# Patient Record
Sex: Female | Born: 1970 | Race: Black or African American | Hispanic: No | Marital: Single | Smoking: Never smoker
Health system: Southern US, Community
[De-identification: ages and names within clinical notes are randomized; demographics above are authoritative.]

## PROBLEM LIST (undated history)

## (undated) DIAGNOSIS — K219 Gastro-esophageal reflux disease without esophagitis: Secondary | ICD-10-CM

## (undated) DIAGNOSIS — I509 Heart failure, unspecified: Secondary | ICD-10-CM

## (undated) DIAGNOSIS — K4090 Unilateral inguinal hernia, without obstruction or gangrene, not specified as recurrent: Secondary | ICD-10-CM

## (undated) DIAGNOSIS — T7840XA Allergy, unspecified, initial encounter: Secondary | ICD-10-CM

## (undated) DIAGNOSIS — D649 Anemia, unspecified: Secondary | ICD-10-CM

## (undated) DIAGNOSIS — Z789 Other specified health status: Secondary | ICD-10-CM

## (undated) DIAGNOSIS — K439 Ventral hernia without obstruction or gangrene: Secondary | ICD-10-CM

## (undated) DIAGNOSIS — I10 Essential (primary) hypertension: Secondary | ICD-10-CM

## (undated) DIAGNOSIS — F32A Depression, unspecified: Secondary | ICD-10-CM

## (undated) HISTORY — PX: TONSILLECTOMY: SUR1361

## (undated) HISTORY — DX: Other specified health status: Z78.9

## (undated) HISTORY — DX: Gastro-esophageal reflux disease without esophagitis: K21.9

## (undated) HISTORY — DX: Heart failure, unspecified: I50.9

## (undated) HISTORY — DX: Essential (primary) hypertension: I10

## (undated) HISTORY — DX: Allergy, unspecified, initial encounter: T78.40XA

## (undated) HISTORY — PX: EYE SURGERY: SHX253

## (undated) HISTORY — DX: Depression, unspecified: F32.A

## (undated) HISTORY — DX: Ventral hernia without obstruction or gangrene: K43.9

## (undated) HISTORY — DX: Anemia, unspecified: D64.9

## (undated) HISTORY — DX: Unilateral inguinal hernia, without obstruction or gangrene, not specified as recurrent: K40.90

## (undated) HISTORY — PX: ABDOMINAL HYSTERECTOMY: SHX81

---

## 2001-10-02 ENCOUNTER — Other Ambulatory Visit: Admission: RE | Admit: 2001-10-02 | Discharge: 2001-10-02 | Payer: Self-pay | Admitting: Obstetrics and Gynecology

## 2003-11-04 ENCOUNTER — Encounter (INDEPENDENT_AMBULATORY_CARE_PROVIDER_SITE_OTHER): Payer: Self-pay | Admitting: *Deleted

## 2003-11-04 ENCOUNTER — Ambulatory Visit (HOSPITAL_BASED_OUTPATIENT_CLINIC_OR_DEPARTMENT_OTHER): Admission: RE | Admit: 2003-11-04 | Discharge: 2003-11-04 | Payer: Self-pay | Admitting: Otolaryngology

## 2003-11-04 ENCOUNTER — Ambulatory Visit (HOSPITAL_COMMUNITY): Admission: RE | Admit: 2003-11-04 | Discharge: 2003-11-04 | Payer: Self-pay | Admitting: Otolaryngology

## 2005-12-16 ENCOUNTER — Encounter (INDEPENDENT_AMBULATORY_CARE_PROVIDER_SITE_OTHER): Payer: Self-pay | Admitting: Specialist

## 2005-12-16 ENCOUNTER — Inpatient Hospital Stay (HOSPITAL_COMMUNITY): Admission: RE | Admit: 2005-12-16 | Discharge: 2005-12-18 | Payer: Self-pay | Admitting: Obstetrics and Gynecology

## 2008-03-19 ENCOUNTER — Emergency Department (HOSPITAL_COMMUNITY): Admission: EM | Admit: 2008-03-19 | Discharge: 2008-03-19 | Payer: Self-pay | Admitting: Emergency Medicine

## 2010-12-25 NOTE — Op Note (Signed)
[name], [name]                          ACCOUNT NO.:  [id number]   MEDICAL RECORD NO.:  [id number]                   PATIENT TYPE:  AMB   LOCATION:  DSC                                  FACILITY:  [facility]   PHYSICIAN:  Onalee Hua L. Annalee Genta, M.D.            DATE OF BIRTH:  10-25-1970   DATE OF PROCEDURE:  11/04/2003  DATE OF DISCHARGE:                                 OPERATIVE REPORT   PREOPERATIVE DIAGNOSIS:  1. Recurrent acute tonsillitis.  2. Chronic cryptic tonsillitis.  3. Tonsillar hypertrophy.   POSTOPERATIVE DIAGNOSIS:  1. Recurrent acute tonsillitis.  2. Chronic cryptic tonsillitis.  3. Tonsillar hypertrophy.   OPERATION PERFORMED:  Tonsillectomy.   SURGEON:  Kinnie Scales. Annalee Genta, M.D.   ANESTHESIA:  General endotracheal.   COMPLICATIONS:  None.   ESTIMATED BLOOD LOSS:  Minimal.   DISPOSITION:  The patient was then transferred from the operating room to  the recovery room in stable condition.   INDICATIONS FOR PROCEDURE:  [name] is a 40 year old black female who was  referred for evaluation of recurrent tonsillitis, chronic tonsillar  discharge and tonsillar enlargement.  She was treated with multiple course  of antibiotics, complained of chronic tonsil discharge and sore throat.  Given her history and examination, I recommended that we consider her for  tonsillectomy under general anesthesia.  The risks, benefits and possible  complications of the procedure were discussed in detail with the patient  prior to surgery.   DESCRIPTION OF PROCEDURE:  The patient was brought to the operating room to  the operating room on November 04, 2003 and placed in supine position on the  operating table.  General endotracheal anesthesia was established without  difficulty.  When the patient was adequately anesthetized, the Crowe-Davis  mouth gag was inserted without difficulty.  There were no loose or broken  teeth and the hard and soft palate were intact.  The patient had  significant  tonsillar hypertrophy.  Beginning on the patient's left hand side,  dissecting the subcapsular fascia with a Harmonic scalpel, the entire left  tonsil was removed.  Dissection was carried out from superior pole to tongue  base.  The right tonsil was removed in similar fashion.  Several areas of  point hemorrhage were cauterized using suction cautery.  Tonsillar fossa was  gently abraded with a dry tonsil sponge.  No active bleeding.  The patient's  oral cavity and oropharynx were then irrigated with saline and suctioned.  The Crowe-Davis mouth gag was released and removed.  There were no loose or broken teeth and no active bleeding.  The orogastric  tube was passed.  The stomach contents were aspirated.  The patient was then  awakened from anesthetic.  She was extubated and transferred from the  operating room to the recovery room in stable condition.  There were no  complications.  Blood loss minimal.  Kinnie Scales. Annalee Genta, M.D.    DLS/MEDQ  D:  60/45/4098  T:  11/04/2003  Job:  [id number]

## 2010-12-25 NOTE — Discharge Summary (Signed)
[name], [name]              ACCOUNT NO.:  [id number]   MEDICAL RECORD NO.:  [id number]          PATIENT TYPE:  INP   LOCATION:  A417                          FACILITY:  [facility]   PHYSICIAN:  Tilda Burrow, M.D. DATE OF BIRTH:  [birth year]   DATE OF ADMISSION:  12/16/2005  DATE OF DISCHARGE:  05/12/2007LH                                 DISCHARGE SUMMARY   ADMISSION DIAGNOSES:  1.  Menorrhagia.  2.  Menometrorrhagia.  3.  Anemia.  4.  Umbilical hernia.   DISCHARGE DIAGNOSES:  1.  Menorrhagia.  2.  Menometrorrhagia.  3.  Anemia.  4.  Umbilical hernia.  5.  Adenomyosis.  6.  Intramural leiomyoma (fibroids).   PROCEDURE:  Total abdominal hysterectomy, umbilical herniorrhaphy, wide  excision of abdominal wall cicatrix.   DISCHARGE MEDICATIONS:  1.  Tylox 1 q.6h. p.r.n. pain.  2.  Iron sulfate 325 mg twice daily x30 days.  3.  Laxative of choice p.r.n.   FOLLOW UP:  Follow up in one week for drain removal and staple removal.   HOSPITAL COURSE:  This 40 year old female was admitted for hysterectomy and  umbilical herniorrhaphy for heavy prolonged periods with subsequent anemia.  She had a prior cesarean section x2 and had resulted in an old scar that she  wished to have significantly revised.  This was performed.   HOSPITAL COURSE:  She was admitted with laboratory evaluation including  hemoglobin of 10.8, hematocrit of 32.6, platelet count 391,000.  Potassium  3.7, BUN 7, creatinine 0.6.  She underwent an uncomplicated hysterectomy  with postoperative hemoglobin 9.9 and hematocrit 29.9.  The patient's two  day postoperative course was straightforward with the patient stable for  discharge in excellent condition with passage of flatus, two bowel movements  having occurred by discharge on Dec 18, 2005.  She was afebrile.  She had  routine postsurgical instructions given watching for fever, etc. with the  patient allowed to shower, given instructions on how to manage the  JP  drains, and with plans for follow up Dec 22, 2005 with staple and drain  removal.   Pathology reports reveals a 153 gm uterus, symmetric with small fibroids and  adenomyosis noted on pathology.  The hernia sac was unremarkable other than  documentation of the hernia sac and there was no suspicion of malignancy.      Tilda Burrow, M.D.  Electronically Signed     JVF/MEDQ  D:  12/18/2005  T:  12/20/2005  Job:  [id number]   cc:   [facility]

## 2010-12-25 NOTE — H&P (Signed)
[name], [name]              ACCOUNT NO.:  [id number]   MEDICAL RECORD NO.:  [id number]          PATIENT TYPE:  AMB   LOCATION:  DAY                           FACILITY:  [facility]   PHYSICIAN:  Tilda Burrow, M.D. DATE OF BIRTH:  1971/01/30   DATE OF ADMISSION:  DATE OF DISCHARGE:  LH                                HISTORY & PHYSICAL   ADMISSION DIAGNOSES:  1.  Menorrhagia.  2.  Menometrorrhagia.  3.  Declining endometrial ablation.  4.  Anemia secondary to menometrorrhagia.   HISTORY OF PRESENT ILLNESS:  This 40 year old female is admitted for  abdominal hysterectomy.  Menses are prolonged and intermittent.  She has  menses sufficiently heavy to cause flooding 1 to 2 times per month.  She  describes her menses in impressive terms.  She has had heavy menses since  teenager.  She has only been controlled on oral contraceptives.  At 80, she  is looking for more definitive solution.  She declines endometrial ablation.  An ultrasound has been performed which shows a uterus which measures 9.4 cm  in length by 4.7 cm AP x 6.4 cm with a normal secretory endometrium on the  date of ultrasound.  She has normal adnexal structures.   PAST MEDICAL HISTORY:  Notable for short body habitus, 4 feet 9 inches, as  well as obesity.   PAST SURGICAL HISTORY:  The patient had prior cesarean sections x2 for  cephalopelvic disproportion.   She had medical evaluation including normal coagulation studies.  Hemoglobin  10, hematocrit 34.  Thyroid function tests showing TSH 0.839, completely  within normal limits.  GC and chlamydia cultures negative.   PLAN:  Total abdominal hysterectomy through Pfannenstiel incision, slight  wide excision of old scar on Dec 16, 2005.      Tilda Burrow, M.D.  Electronically Signed     JVF/MEDQ  D:  12/14/2005  T:  12/14/2005  Job:  [id number]   cc:   [facility]   [facility]  Fax: [phone]

## 2010-12-25 NOTE — Op Note (Signed)
NAMEJYLIAN, [name]              ACCOUNT NO.:  [id number]   MEDICAL RECORD NO.:  [id number]          PATIENT TYPE:  INP   LOCATION:  A417                          FACILITY:  [facility]   PHYSICIAN:  Tilda Burrow, M.D. DATE OF BIRTH:  1970-12-25   DATE OF PROCEDURE:  12/16/2005  DATE OF DISCHARGE:                                 OPERATIVE REPORT   PREOPERATIVE DIAGNOSES:  1.  Menorrhagia and metrorrhagia.  2.  Anemia.  3.  Umbilical hernia.   POSTOPERATIVE DIAGNOSES:  1.  Menorrhagia and metrorrhagia.  2.  Anemia.  3.  Repair of umbilical hernia.   OPERATION/PROCEDURE:  1.  Total abdominal hysterectomy.  2.  Wide excision of abdominal cicatrix.   SURGEON:  Tilda Burrow, M.D.   ASSISTANTAmie Critchley, C.S.T.   ANESTHESIA:  General.   COMPLICATIONS:  None.   FINDINGS:  Marked abdominal wall fibrosis and irregularity of the rectus  muscle.  Huge 4 x 5 cm umbilical hernia.   DESCRIPTION OF PROCEDURE:  The patient was taken to the operating room,  prepped and draped for abdominal procedure with Foley catheter in place,  vaginal tissues having been prepped.  The old Pfannenstiel incision was  marked.  As per the patient's request, this was widely excised with a 30 cm  long incision from just outside the anterior superior iliac crest on each  side of the midline and excising from the old scar line upward and taking  out the natural crease with the lower abdomen.  This resulted in removal of  approximately 30 cm long x 10 cm wide ellipse of skin and underlying fatty  tissue.  Bovie cautery was used as necessary to achieve hemostasis in  subcutaneous spaces.  Once this was taken down and hemostasis confirmed, we  proceeded with the abdominal portion of the case.  The rectus muscles were  split in the midline.  It was technically challenging to identify the  midline due to fibrosis from the prior cesarean sections.  We were able to  enter the midline at the upper aspects of  the incision, being careful to  elevate the preperitoneal tissue layer by layer.  Once we entered, then the  rest of the peritoneum opening was straightforward.  The surgeon's index  finger was kept underneath the incision as it was opened to insure safety of  underlying abdominal contents.  Bowel was satisfactorily decompressed from  its preoperative bowel prep, was elevated and packed away using three  moistened laparotomy tapes.   At this time we proceeded with hysterectomy.  The uterus was easily  accessible.  Had some anterior bladder adhesions from the prior cesarean  section repairs and healing.  These adhesions were taken down.  Bladder flap  was developed.  The uterus was boggy, soft, with a feeling suggestive of  adenomyosis.  Round ligament were clamped, cut and suture taken down.  Utero-  ovarian ligaments on either side were isolated, clamped cut and suture  ligated with Kelly clamps, mayo scissors and 0 chromic suture ligature.  The  uterine vessels were then crossclamped with curved  Heaney clamp and Kelly  clamp for backbleeding, transected and ligated.  Marching down to cardinal  ligaments on either side with small bites using straight Heaney clamps, Mayo  scissors and 0 chromic suture ligatures, took things down to the level of  the cervix.  The transverse white shiny tissue of the vaginal apex could be  identified and a stab incision made in the anterior cervical vaginal fornix.  Bladder was well out of the way.  Cervix was then amputated off the vaginal  cuff.  Cuff was then closed with Aldridge stitch at each lateral vaginal  angle_ to attach vaginal tissues to the remnants of a cardinal ligament.  The posterior width of the posterior vagina was reduced by a midline suture  which resulted in a little posterior pouch to improve functional vaginal  length.  The remainder of the vaginal cuff was closed transversely in a  running fashion with good hemostasis achieved.   Laparotomy moistened  laparotomy tape was placed in the pelvic floor after irrigation and then we  proceeded with umbilical repair.   UMBILICAL HERNIORRHAPHY:  Umbilical herniorrhaphy was then performed.  We  made a midline incision over the umbilicus after placing a laparotomy tape  directly into the hernia sac.  The hernia sac was distinctly defined by  margins.  We entered the cavity, opened the incision in the midline for  distance of about 3 cm, and then peeled a very thick fibrous peritoneal  tissues off the underlying connective tissue.  Once this was done, we were  able to identify the firm fibrous rim to the hernia defect.  Allis clamps  were used to grasp the edges.  The peritoneum was trimmed sufficiently to  later result in a good easy closure with interrupted Prolene sutures, used 2-  0 Prolene, with a transverse closure of the peritoneum.  The knots were  exterior to the peritoneal cavity.  We then were able to close the fascial  hernia defect, trimmed in the midline, a sagittal closure this time using a  series of running 0 Prolene suture, pulling the tissues together with good  obliteration of the sagittal defect.   The procedure was then continued by elimination of the potential space  defect in the subcutaneous fatty tissue.  This was pulled together with  interrupted 2-0 plain.  Skin over the hernia sac was trimmed and then the  skin edges pulled together with 2-0 plain suture followed by staple closure  of the skin.   Returning to the lower abdominal incision, we removed all laparotomy tapes,  inspected for hemostasis.  The appendix was inspected found to be  impressively large in its natural  anterior position, the appendix was at  least 10 cm long and quite large with no suspicion of acute inflammation.  We then proceeded with inspection of the pelvis.   Hemostasis was confirmed.  The bladder flap did not require reapproximation due to the natural resting  position of the peritoneal surface tissues.  We  then closed the anterior peritoneum with 2-0 chromic, closed the fascia with  running 0 Prolene, the midline incision then closed with the abdominal wall  incision.  There was some mobilization of the inferior aspects of the  incision necessary to get good tissue reapproximation. This was performed  then the subcutaneous fatty tissues were reapproximated using 2-0 plain  interrupted sutures with underlying flat Jackson-Pratt drains placed on  either side.  Stapled closure of the skin completed the procedure.  The  patient went to the recovery room in good condition with estimated blood  loss of 250 mL.     Tilda Burrow, M.D.  Electronically Signed    JVF/MEDQ  D:  12/18/2005  T:  12/20/2005  Job:  [id number]

## 2013-01-13 ENCOUNTER — Encounter (HOSPITAL_COMMUNITY): Payer: Self-pay | Admitting: *Deleted

## 2013-01-13 ENCOUNTER — Emergency Department (HOSPITAL_COMMUNITY)
Admission: EM | Admit: 2013-01-13 | Discharge: 2013-01-14 | Disposition: A | Discharge status: Home / Self Care | Payer: Self-pay | Attending: Emergency Medicine | Admitting: Emergency Medicine

## 2013-01-13 DIAGNOSIS — R109 Unspecified abdominal pain: Secondary | ICD-10-CM

## 2013-01-13 DIAGNOSIS — R112 Nausea with vomiting, unspecified: Secondary | ICD-10-CM | POA: Insufficient documentation

## 2013-01-13 DIAGNOSIS — R1013 Epigastric pain: Secondary | ICD-10-CM | POA: Insufficient documentation

## 2013-01-13 DIAGNOSIS — Z90710 Acquired absence of both cervix and uterus: Secondary | ICD-10-CM | POA: Insufficient documentation

## 2013-01-13 LAB — BASIC METABOLIC PANEL
Chloride: 100 mEq/L (ref 96–112)
Creatinine, Ser: 0.57 mg/dL (ref 0.50–1.10)
GFR calc Af Amer: >90 mL/min (ref >90)
Sodium: 137 mEq/L (ref 135–145)

## 2013-01-13 LAB — CBC WITH DIFFERENTIAL/PLATELET
Basophils Absolute: 0 10*3/uL (ref 0.0–0.1)
Basophils Relative: 0 % (ref 0–1)
HCT: 35.3 % — ABNORMAL LOW (ref 36.0–46.0)
MCHC: 33.4 g/dL (ref 30.0–36.0)
Monocytes Absolute: 0.7 10*3/uL (ref 0.1–1.0)
Neutro Abs: 8.2 10*3/uL — ABNORMAL HIGH (ref 1.7–7.7)
Neutrophils Relative %: 65 % (ref 43–77)
Platelets: 292 10*3/uL (ref 150–400)
RDW: 15.7 % — ABNORMAL HIGH (ref 11.5–15.5)

## 2013-01-13 MED ORDER — ONDANSETRON HCL 4 MG/2ML IJ SOLN
4.0000 mg | Freq: Once | INTRAMUSCULAR | Status: AC
  Administered 2013-01-13: 4 mg via INTRAVENOUS
  Filled 2013-01-13: qty 2

## 2013-01-13 NOTE — ED Provider Notes (Signed)
History    This chart was scribed for Bridget Gaskins, MD by Leone Payor, ED Scribe. This patient was seen in room APA19/APA19 and the patient's care was started 11:44 PM.   CSN: [id number]  Arrival date & time 01/13/13  2208   First MD Initiated Contact with Patient 01/13/13 2336      Chief Complaint  Patient presents with  . Nausea  . Emesis  . Abdominal Pain     Patient is a 42 y.o. female presenting with vomiting and abdominal pain. The history is provided by the patient. No language interpreter was used.  Emesis Severity:  Moderate Duration:  1 hour Timing:  Constant Quality:  Stomach contents and bilious material Progression:  Improving Chronicity:  New Recent urination:  Normal Context: not post-tussive and not self-induced   Relieved by:  Nothing Worsened by:  Nothing tried Ineffective treatments:  None tried Associated symptoms: abdominal pain   Associated symptoms: no diarrhea   Abdominal Pain Associated symptoms include abdominal pain. Pertinent negatives include no chest pain and no shortness of breath.    HPI Comments: [name] is a 42 y.o. female who presents to the Emergency Department complaining of sudden onset, intermittent, non-radiating abdominal pain that started about 2 hours ago. She describes the pain as cramping and states soon after onset of abdominal pain she began vomiting. States she was constantly vomiting for about 45 minutes. At first she vomited stomach contents and then yellow material. States she felt normal earlier today. Has had something similar to this pain before but was never evaluated for it. Reports last eating at 2:30PM today. Has h/o hysterectomy and 2 c-sections in the past. She denies diarrhea, fever, chest pain, SOB, dysuria.     PMH - none  Past Surgical History  Procedure Laterality Date  . Abdominal hysterectomy    . Tonsillectomy    . Eye surgery      History reviewed. No pertinent family  history.  History  Substance Use Topics  . Smoking status: Never Smoker   . Smokeless tobacco: Not on file  . Alcohol Use: No    OB History   Grav Para Term Preterm Abortions TAB SAB Ect Mult Living                  Review of Systems  Constitutional: Negative for fever.  Respiratory: Negative for shortness of breath.   Cardiovascular: Negative for chest pain.  Gastrointestinal: Positive for vomiting and abdominal pain. Negative for diarrhea.  Genitourinary: Negative for dysuria.  All other systems reviewed and are negative.    Allergies  Other  Home Medications  No current outpatient prescriptions on file.  BP 157/90  Pulse 61  Temp(Src) 97.5 F (36.4 C)  Resp 20  Ht 4\' 10"  (1.473 m)  Wt 236 lb (107.049 kg)  BMI 49.34 kg/m2  SpO2 98%  Physical Exam CONSTITUTIONAL: Well developed/well nourished HEAD: Normocephalic/atraumatic EYES: EOMI/PERRL. No icterus.  ENMT: Mucous membranes moist NECK: supple no meningeal signs SPINE:entire spine nontender CV: S1/S2 noted, no murmurs/rubs/gallops noted LUNGS: Lungs are clear to auscultation bilaterally, no apparent distress ABDOMEN: soft, no rebound or guarding. Moderate epigastric tenderness.  GU:no cva tenderness NEURO: Pt is awake/alert, moves all extremitiesx4 EXTREMITIES: pulses normal, full ROM SKIN: warm, color normal PSYCH: no abnormalities of mood noted  ED Course  Procedures   DIAGNOSTIC STUDIES: Oxygen Saturation is 98% on room air, normal by my interpretation.    COORDINATION OF CARE: 11:45 PM Discussed  treatment plan with pt at bedside and pt agreed to plan.   Labs Reviewed  CBC WITH DIFFERENTIAL - Abnormal; Notable for the following:    WBC 12.7 (*)    Hemoglobin 11.8 (*)    HCT 35.3 (*)    RDW 15.7 (*)    Neutro Abs 8.2 (*)    All other components within normal limits  BASIC METABOLIC PANEL - Abnormal; Notable for the following:    Potassium 3.4 (*)    Glucose, Bld 129 (*)    All other  components within normal limits  URINALYSIS, ROUTINE W REFLEX MICROSCOPIC   Pt felt improved.  She reports most of her pain was in epigastric region She is no longer vomiting.  She would like to go home  I feel biliary colic is possible however I doubt acute cholecystitis and do not feel she needs emergent transfer for ultrasound.  I spoke about need for further followup.  She does not have immediate followup with PCP.  After further discussion we decided to order next day abdominal ultrasound due to lack of access as outpatient.  I discussed strict return precautions before US imaging including worsened pain/vomiting.    I doubt appendicitis or other acute abdominal process.  I doubt ACS (EKG unremarkable, low risk for ACS) She had no lower abd tenderness and no dysuria to suggest UTI/pyelo  MDM  Nursing notes including past medical history and social history reviewed and considered in documentation Labs/vital reviewed and considered     Date: 01/14/2013  Rate: 64  Rhythm: normal sinus rhythm  QRS Axis: normal  Intervals: normal  ST/T Wave abnormalities: normal  Conduction Disutrbances:none  Narrative Interpretation:   Old EKG Reviewed: none available      I personally performed the services described in this documentation, which was scribed in my presence. The recorded information has been reviewed and is accurate.        Bridget Gaskins, MD 01/14/13 [phone]

## 2013-01-13 NOTE — ED Notes (Signed)
Pt reports sudden onset of abdominal pain that started about 1 hour ago.

## 2013-01-13 NOTE — ED Notes (Signed)
Pt c/o epigastric pain radiating down to lower abdomen and n/v x 45 mins.

## 2013-01-14 ENCOUNTER — Ambulatory Visit (HOSPITAL_COMMUNITY)
Admit: 2013-01-14 | Discharge: 2013-01-14 | Disposition: A | Discharge status: Home / Self Care | Payer: Self-pay | Source: Ambulatory Visit | Attending: Emergency Medicine | Admitting: Emergency Medicine

## 2013-01-14 ENCOUNTER — Other Ambulatory Visit: Payer: Self-pay

## 2013-01-14 DIAGNOSIS — R1013 Epigastric pain: Secondary | ICD-10-CM | POA: Insufficient documentation

## 2013-01-14 LAB — HEPATIC FUNCTION PANEL
AST: 16 U/L (ref 0–37)
Bilirubin, Direct: 0.1 mg/dL (ref 0.0–0.3)
Total Bilirubin: 0.2 mg/dL — ABNORMAL LOW (ref 0.3–1.2)

## 2013-01-14 LAB — URINE MICROSCOPIC-ADD ON

## 2013-01-14 LAB — URINALYSIS, ROUTINE W REFLEX MICROSCOPIC
Ketones, ur: NEGATIVE mg/dL
Leukocytes, UA: NEGATIVE
Nitrite: NEGATIVE
Urobilinogen, UA: 0.2 mg/dL (ref 0.0–1.0)

## 2013-01-14 LAB — LIPASE, BLOOD: Lipase: 16 U/L (ref 11–59)

## 2013-01-14 NOTE — ED Notes (Signed)
Pt alert & oriented x4, stable gait. Patient given discharge instructions, paperwork & prescription(s). Patient  instructed to stop at the registration desk to finish any additional paperwork. Patient verbalized understanding. Pt left department w/ no further questions. 

## 2013-10-11 ENCOUNTER — Other Ambulatory Visit (HOSPITAL_COMMUNITY): Payer: Self-pay | Admitting: Pulmonary Disease

## 2013-10-11 DIAGNOSIS — R112 Nausea with vomiting, unspecified: Secondary | ICD-10-CM

## 2013-10-11 DIAGNOSIS — R109 Unspecified abdominal pain: Secondary | ICD-10-CM

## 2013-10-12 ENCOUNTER — Ambulatory Visit (HOSPITAL_COMMUNITY)
Admission: RE | Admit: 2013-10-12 | Discharge: 2013-10-12 | Disposition: A | Discharge status: Home / Self Care | Payer: BC Managed Care – PPO | Source: Ambulatory Visit | Attending: Pulmonary Disease | Admitting: Pulmonary Disease

## 2013-10-12 DIAGNOSIS — R112 Nausea with vomiting, unspecified: Secondary | ICD-10-CM

## 2013-10-12 DIAGNOSIS — R109 Unspecified abdominal pain: Secondary | ICD-10-CM

## 2013-10-12 DIAGNOSIS — K439 Ventral hernia without obstruction or gangrene: Secondary | ICD-10-CM | POA: Insufficient documentation

## 2013-10-12 MED ORDER — IOHEXOL 300 MG/ML  SOLN
100.0000 mL | Freq: Once | INTRAMUSCULAR | Status: AC | PRN
  Administered 2013-10-12: 100 mL via INTRAVENOUS

## 2013-10-16 ENCOUNTER — Other Ambulatory Visit (HOSPITAL_COMMUNITY): Payer: Self-pay | Admitting: Pulmonary Disease

## 2013-10-16 DIAGNOSIS — R109 Unspecified abdominal pain: Secondary | ICD-10-CM

## 2013-10-17 ENCOUNTER — Encounter (HOSPITAL_COMMUNITY)
Admission: RE | Admit: 2013-10-17 | Discharge: 2013-10-17 | Disposition: A | Discharge status: Home / Self Care | Payer: BC Managed Care – PPO | Source: Ambulatory Visit | Attending: Pulmonary Disease | Admitting: Pulmonary Disease

## 2013-10-17 ENCOUNTER — Encounter (HOSPITAL_COMMUNITY): Payer: Self-pay

## 2013-10-17 DIAGNOSIS — R109 Unspecified abdominal pain: Secondary | ICD-10-CM

## 2013-10-17 MED ORDER — SINCALIDE 5 MCG IJ SOLR
INTRAMUSCULAR | Status: AC
  Administered 2013-10-17: 2.14 ug via INTRAVENOUS
  Filled 2013-10-17: qty 5

## 2013-10-17 MED ORDER — STERILE WATER FOR INJECTION IJ SOLN
INTRAMUSCULAR | Status: AC
  Administered 2013-10-17: 5 mL via INTRAVENOUS
  Filled 2013-10-17: qty 10

## 2013-10-17 MED ORDER — TECHNETIUM TC 99M MEBROFENIN IV KIT
5.0000 | PACK | Freq: Once | INTRAVENOUS | Status: AC | PRN
  Administered 2013-10-17: 5 via INTRAVENOUS

## 2013-11-20 ENCOUNTER — Other Ambulatory Visit: Payer: Self-pay | Admitting: Internal Medicine

## 2013-11-20 ENCOUNTER — Encounter (HOSPITAL_COMMUNITY): Payer: Self-pay | Admitting: Pharmacy Technician

## 2013-11-20 ENCOUNTER — Encounter (INDEPENDENT_AMBULATORY_CARE_PROVIDER_SITE_OTHER): Payer: Self-pay

## 2013-11-20 ENCOUNTER — Ambulatory Visit (INDEPENDENT_AMBULATORY_CARE_PROVIDER_SITE_OTHER): Payer: BC Managed Care – PPO | Admitting: Gastroenterology

## 2013-11-20 ENCOUNTER — Encounter: Payer: Self-pay | Admitting: Gastroenterology

## 2013-11-20 VITALS — BP 130/80 | HR 82 | Temp 98.2°F | Ht <= 58 in | Wt 226.8 lb

## 2013-11-20 DIAGNOSIS — R1013 Epigastric pain: Secondary | ICD-10-CM

## 2013-11-20 DIAGNOSIS — K3189 Other diseases of stomach and duodenum: Secondary | ICD-10-CM

## 2013-11-20 DIAGNOSIS — R1319 Other dysphagia: Secondary | ICD-10-CM

## 2013-11-20 NOTE — Progress Notes (Signed)
    Referring Provider: Hawkins, Edward L, MD Primary Care Physician:  HAWKINS,EDWARD L, MD Primary Gastroenterologist:  Dr. Rourk  Chief Complaint  Patient presents with  . Abdominal Pain  . Nausea  . Emesis    HPI:   [name] presents today at the request of Dr. Hawkins secondary to abdominal pain. CT March 2015 with anterior abdominal wall hernia only containing fat and small amount of fluid. No bowel incarceration. HIDA with EF of 88%, no symptoms during CCK infusion. US abdomen in June 2014 without gallstones.   Onset June of last year. Gets very hot then starts pouring sweat. Stomach starts cramping/squeezing pain in epigastric region, lasts for a long time until able to vomit. No pain with eating. No reflux symptoms. In past two weeks no vomiting, just lots of nausea. Not sure what she can or can't eat or drink. No specific triggers. Sometimes even happens with water. No dysphagia. No melena. No significant changes in bowel habits. Occasional constipation. Takes Miralax as needed. Just finished 14 day course of Prilosec OTC without significant change.   Past Medical History  Diagnosis Date  . Medical history non-contributory     Past Surgical History  Procedure Laterality Date  . Abdominal hysterectomy    . Tonsillectomy    . Eye surgery    . Cesarean section      x 2    No current outpatient prescriptions on file.   No current facility-administered medications for this visit.    Allergies as of 11/20/2013 - Review Complete 11/20/2013  Allergen Reaction Noted  . Other  01/13/2013    Family History  Problem Relation Age of Onset  . Colon cancer Neg Hx     History   Social History  . Marital Status: Single    Spouse Name: N/A    Number of Children: N/A  . Years of Education: N/A   Occupational History  . [employer]    Social History Main Topics  . Smoking status: Never Smoker   . Smokeless tobacco: Not on file  . Alcohol  Use: No  . Drug Use: No  . Sexual Activity: Not on file   Other Topics Concern  . Not on file   Social History Narrative  . No narrative on file    Review of Systems: As mentioned in HPI.   Physical Exam: BP 130/80  Pulse 82  Temp(Src) 98.2 F (36.8 C) (Oral)  Ht 4' 8" (1.422 m)  Wt 226 lb 12.8 oz (102.876 kg)  BMI 50.88 kg/m2 General:   Alert and oriented. Well-developed, well-nourished, pleasant and cooperative. Head:  Normocephalic and atraumatic. Eyes:  Conjunctiva pink, sclera clear, no icterus.   Conjunctiva pink. Ears:  Normal auditory acuity. Nose:  No deformity, discharge,  or lesions. Mouth:  No deformity or lesions, mucosa pink and moist.  Neck:  Supple, without mass or thyromegaly. Lungs:  Clear to auscultation bilaterally, without wheezing, rales, or rhonchi.  Heart:  S1, S2 present without murmurs noted.  Abdomen:  +BS, soft, non-tender and non-distended. Without mass or HSM. No rebound or guarding. No hernias noted. Rectal:  Deferred  Msk:  Symmetrical without gross deformities. Normal posture. Extremities:  Without clubbing or edema. Neurologic:  Alert and  oriented x4;  grossly normal neurologically. Skin:  Intact, warm and dry without significant lesions or rashes Cervical Nodes:  No significant cervical adenopathy. Psych:  Alert and cooperative. Normal mood and affect.  Outside labs Feb   2015:  Hgb 11.3, Hct 34, WBC 11.6, LFTs normal, amylase normal.

## 2013-11-20 NOTE — Patient Instructions (Signed)
Start taking Nexium once each morning, 30 minutes before breakfast. We have provided samples.  We have scheduled you for an upper endoscopy with Dr. Jena Gauss in the near future.  Start keeping a log of when you have these episodes to see if it is related to any food or certain triggers (stress, anxiety).

## 2013-11-22 NOTE — Assessment & Plan Note (Signed)
43 year old female with several month history of epigastric pain intermittently, now with new onset of nausea. Unable to identify specific triggers, and she has no relief until after vomiting. No typical reflux symptoms or improvement with OTC PPI for 14 day course. Gallbladder remains in situ but [place] of abdomen and HIDA on file as noted above. Question of anxiety-induced component but unable to rule out actual GI etiology.   Recommend trial of Nexium once daily. Proceed with upper endoscopy in the near future with Dr. Jena Gauss. The risks, benefits, and alternatives have been discussed in detail with patient. They have stated understanding and desire to proceed.  Consider elective cholecystectomy in future if no significant findings on EGD I have also asked patient to keep a log of episodes to determine any precipitating factors.

## 2013-11-26 NOTE — Progress Notes (Signed)
cc'd to pcp 

## 2013-11-28 ENCOUNTER — Other Ambulatory Visit: Payer: Self-pay

## 2013-11-28 ENCOUNTER — Encounter (HOSPITAL_COMMUNITY): Payer: Self-pay | Admitting: *Deleted

## 2013-11-28 ENCOUNTER — Ambulatory Visit (HOSPITAL_COMMUNITY)
Admission: RE | Admit: 2013-11-28 | Discharge: 2013-11-28 | Disposition: A | Discharge status: Home / Self Care | Payer: BC Managed Care – PPO | Source: Ambulatory Visit | Attending: Internal Medicine | Admitting: Internal Medicine

## 2013-11-28 ENCOUNTER — Encounter (HOSPITAL_COMMUNITY)
Admission: RE | Disposition: A | Discharge status: Home / Self Care | Payer: Self-pay | Source: Ambulatory Visit | Attending: Internal Medicine

## 2013-11-28 DIAGNOSIS — K2970 Gastritis, unspecified, without bleeding: Secondary | ICD-10-CM

## 2013-11-28 DIAGNOSIS — I499 Cardiac arrhythmia, unspecified: Secondary | ICD-10-CM | POA: Insufficient documentation

## 2013-11-28 DIAGNOSIS — R1319 Other dysphagia: Secondary | ICD-10-CM

## 2013-11-28 DIAGNOSIS — R1013 Epigastric pain: Secondary | ICD-10-CM | POA: Insufficient documentation

## 2013-11-28 DIAGNOSIS — R634 Abnormal weight loss: Secondary | ICD-10-CM | POA: Insufficient documentation

## 2013-11-28 DIAGNOSIS — K2990 Gastroduodenitis, unspecified, without bleeding: Secondary | ICD-10-CM

## 2013-11-28 DIAGNOSIS — R112 Nausea with vomiting, unspecified: Secondary | ICD-10-CM | POA: Insufficient documentation

## 2013-11-28 HISTORY — PX: ESOPHAGOGASTRODUODENOSCOPY: SHX5428

## 2013-11-28 SURGERY — EGD (ESOPHAGOGASTRODUODENOSCOPY)
Anesthesia: Moderate Sedation

## 2013-11-28 MED ORDER — MEPERIDINE HCL 100 MG/ML IJ SOLN
INTRAMUSCULAR | Status: DC | PRN
  Administered 2013-11-28 (×3): 25 mg via INTRAVENOUS
  Administered 2013-11-28: 50 mg via INTRAVENOUS

## 2013-11-28 MED ORDER — MEPERIDINE HCL 100 MG/ML IJ SOLN
INTRAMUSCULAR | Status: DC
Start: 2013-11-28 — End: 2013-11-28
  Filled 2013-11-28: qty 2

## 2013-11-28 MED ORDER — LIDOCAINE VISCOUS 2 % MT SOLN
OROMUCOSAL | Status: AC
  Filled 2013-11-28: qty 15

## 2013-11-28 MED ORDER — ONDANSETRON HCL 4 MG/2ML IJ SOLN
INTRAMUSCULAR | Status: AC
  Filled 2013-11-28: qty 2

## 2013-11-28 MED ORDER — MIDAZOLAM HCL 5 MG/5ML IJ SOLN
INTRAMUSCULAR | Status: DC | PRN
  Administered 2013-11-28: 1 mg via INTRAVENOUS
  Administered 2013-11-28: 2 mg via INTRAVENOUS
  Administered 2013-11-28: 1 mg via INTRAVENOUS
  Administered 2013-11-28: 2 mg via INTRAVENOUS

## 2013-11-28 MED ORDER — ONDANSETRON HCL 4 MG/2ML IJ SOLN
INTRAMUSCULAR | Status: DC | PRN
  Administered 2013-11-28: 4 mg via INTRAVENOUS

## 2013-11-28 MED ORDER — SODIUM CHLORIDE 0.9 % IV SOLN
INTRAVENOUS | Status: DC
  Administered 2013-11-28: 1000 mL via INTRAVENOUS

## 2013-11-28 MED ORDER — SIMETHICONE 40 MG/0.6ML PO SUSP
ORAL | Status: DC | PRN
  Administered 2013-11-28: 13:00:00

## 2013-11-28 MED ORDER — MIDAZOLAM HCL 5 MG/5ML IJ SOLN
INTRAMUSCULAR | Status: AC
  Filled 2013-11-28: qty 10

## 2013-11-28 MED ORDER — LIDOCAINE VISCOUS 2 % MT SOLN
OROMUCOSAL | Status: DC | PRN
Start: 2013-11-28 — End: 2013-11-28
  Administered 2013-11-28: 3 mL via OROMUCOSAL

## 2013-11-28 NOTE — Interval H&P Note (Signed)
History and Physical Interval Note:  11/28/2013 12:39 PM  [name]  has presented today for surgery, with the diagnosis of DYSPHAGIA  The various methods of treatment have been discussed with the patient and family. After consideration of risks, benefits and other options for treatment, the patient has consented to  Procedure(s) with comments: ESOPHAGOGASTRODUODENOSCOPY (EGD) (N/A) - 12:15 as a surgical intervention .  The patient's history has been reviewed, patient examined, no change in status, stable for surgery.  I have reviewed the patient's chart and labs.  Questions were answered to the patient's satisfaction.    No Change with brief course of Nexium. EGD per plan.  The risks, benefits, limitations, alternatives and imponderables have been reviewed with the patient. Potential for esophageal dilation, biopsy, etc. have also been reviewed.  Questions have been answered. All parties agreeable.  Gerrit Friends Teon Hudnall

## 2013-11-28 NOTE — Op Note (Signed)
[facility] [address] [place], [address]   ENDOSCOPY PROCEDURE REPORT  PATIENT: Bridget, Hernandez  MR#: [id number] BIRTHDATE: [birth year] , 42  yrs. old GENDER: Female ENDOSCOPIST: R.  Roetta Sessions, MD FACP [facility] REFERRED BY:  Kari Baars, M.D. PROCEDURE DATE:  11/28/2013 PROCEDURE:     EGD with gastric biopsy  INDICATIONS:    Intermittent epigastric pain, nausea vomiting; reported 20 pound weight loss; no improvement with PPI therapy.  INFORMED CONSENT:   The risks, benefits, limitations, alternatives and imponderables have been discussed.  The potential for biopsy, esophogeal dilation, etc. have also been reviewed.  Questions have been answered.  All parties agreeable.  Please see the history and physical in the medical record for more information.  MEDICATIONS:    Versed 6 mg IV and Demerol 125 mg IV in divided doses. Zofran 4 mg IV. Xylocaine gel orally  DESCRIPTION OF PROCEDURE:   The SW-[id number] ([id number])  endoscope was introduced through the mouth and advanced to the second portion of the duodenum without difficulty or limitations.  The mucosal surfaces were surveyed very carefully during advancement of the scope and upon withdrawal.  Retroflexion view of the proximal stomach and esophagogastric junction was performed.      FINDINGS: Normal appearing tubular esophagus. Stomach empty. Minimal submucosal petechiae and patchy erythema diffusely. No ulcer or infiltrating process..  Patent pylorus. Normal first and second portion of the duodenum  THERAPEUTIC / DIAGNOSTIC MANEUVERS PERFORMED:  Biopsies of the gastric mucosa taken for histologic study   COMPLICATIONS:  None  IMPRESSION:    Abnormal gastric mucosa of doubtful clinical significance-status post biopsy  RECOMMENDATIONS:  Followup on pathology. Patient noted to change in her rhythm during the procedure. Possibly intermittent bundle branch block. 12-lead EKG will be obtained  following this procedure and reviewed. Will be sent along with a rhythm strip to Dr. Juanetta Gosling attention.  Further GI recommendations to follow in the near future.    _______________________________ R. Roetta Sessions, MD FACP [facility] eSigned:  R. Roetta Sessions, MD FACP [facility] 11/28/2013 1:12 PM     CC:  PATIENT NAME:  Bridget, Hernandez MR#: [id number]

## 2013-11-28 NOTE — H&P (View-Only) (Signed)
Referring Provider: Fredirick MaudlinHawkins, Edward L, MD Primary Care Physician:  Fredirick MaudlinHAWKINS,EDWARD L, MD Primary Gastroenterologist:  Dr. Jena Gaussourk  Chief Complaint  Patient presents with  . Abdominal Pain  . Nausea  . Emesis    HPI:   [name] presents today at the request of Dr. Juanetta GoslingHawkins secondary to abdominal pain. CT March 2015 with anterior abdominal wall hernia only containing fat and small amount of fluid. No bowel incarceration. HIDA with EF of 88%, no symptoms during CCK infusion. US abdomen in June 2014 without gallstones.   Onset June of last year. Gets very hot then starts pouring sweat. Stomach starts cramping/squeezing pain in epigastric region, lasts for a long time until able to vomit. No pain with eating. No reflux symptoms. In past two weeks no vomiting, just lots of nausea. Not sure what she can or can't eat or drink. No specific triggers. Sometimes even happens with water. No dysphagia. No melena. No significant changes in bowel habits. Occasional constipation. Takes Miralax as needed. Just finished 14 day course of Prilosec OTC without significant change.   Past Medical History  Diagnosis Date  . Medical history non-contributory     Past Surgical History  Procedure Laterality Date  . Abdominal hysterectomy    . Tonsillectomy    . Eye surgery    . Cesarean section      x 2    No current outpatient prescriptions on file.   No current facility-administered medications for this visit.    Allergies as of 11/20/2013 - Review Complete 11/20/2013  Allergen Reaction Noted  . Other  01/13/2013    Family History  Problem Relation Age of Onset  . Colon cancer Neg Hx     History   Social History  . Marital Status: Single    Spouse Name: N/A    Number of Children: N/A  . Years of Education: N/A   Occupational History  . [employer] [place]    Social History Main Topics  . Smoking status: Never Smoker   . Smokeless tobacco: Not on file  . Alcohol  Use: No  . Drug Use: No  . Sexual Activity: Not on file   Other Topics Concern  . Not on file   Social History Narrative  . No narrative on file    Review of Systems: As mentioned in HPI.   Physical Exam: BP 130/80  Pulse 82  Temp(Src) 98.2 F (36.8 C) (Oral)  Ht 4\' 8"  (1.422 m)  Wt 226 lb 12.8 oz (102.876 kg)  BMI 50.88 kg/m2 General:   Alert and oriented. Well-developed, well-nourished, pleasant and cooperative. Head:  Normocephalic and atraumatic. Eyes:  Conjunctiva pink, sclera clear, no icterus.   Conjunctiva pink. Ears:  Normal auditory acuity. Nose:  No deformity, discharge,  or lesions. Mouth:  No deformity or lesions, mucosa pink and moist.  Neck:  Supple, without mass or thyromegaly. Lungs:  Clear to auscultation bilaterally, without wheezing, rales, or rhonchi.  Heart:  S1, S2 present without murmurs noted.  Abdomen:  +BS, soft, non-tender and non-distended. Without mass or HSM. No rebound or guarding. No hernias noted. Rectal:  Deferred  Msk:  Symmetrical without gross deformities. Normal posture. Extremities:  Without clubbing or edema. Neurologic:  Alert and  oriented x4;  grossly normal neurologically. Skin:  Intact, warm and dry without significant lesions or rashes Cervical Nodes:  No significant cervical adenopathy. Psych:  Alert and cooperative. Normal mood and affect.  Outside labs Feb  2015:  Hgb 11.3, Hct 34, WBC 11.6, LFTs normal, amylase normal.

## 2013-11-28 NOTE — Discharge Instructions (Addendum)
EGD Discharge instructions Please read the instructions outlined below and refer to this sheet in the next few weeks. These discharge instructions provide you with general information on caring for yourself after you leave the hospital. Your doctor may also give you specific instructions. While your treatment has been planned according to the most current medical practices available, unavoidable complications occasionally occur. If you have any problems or questions after discharge, please call your doctor. ACTIVITY  You may resume your regular activity but move at a slower pace for the next 24 hours.   Take frequent rest periods for the next 24 hours.   Walking will help expel (get rid of) the air and reduce the bloated feeling in your abdomen.   No driving for 24 hours (because of the anesthesia (medicine) used during the test).   You may shower.   Do not sign any important legal documents or operate any machinery for 24 hours (because of the anesthesia used during the test).  NUTRITION  Drink plenty of fluids.   You may resume your normal diet.   Begin with a light meal and progress to your normal diet.   Avoid alcoholic beverages for 24 hours or as instructed by your caregiver.  MEDICATIONS  You may resume your normal medications unless your caregiver tells you otherwise.  WHAT YOU CAN EXPECT TODAY  You may experience abdominal discomfort such as a feeling of fullness or gas pains.  FOLLOW-UP  Your doctor will discuss the results of your test with you.  SEEK IMMEDIATE MEDICAL ATTENTION IF ANY OF THE FOLLOWING OCCUR:  Excessive nausea (feeling sick to your stomach) and/or vomiting.   Severe abdominal pain and distention (swelling).   Trouble swallowing.   Temperature over 101 F (37.8 C).   Rectal bleeding or vomiting of blood.     Further recommendations to follow pending review of pathology report     Followup with Dr. Juanetta Gosling regarding abnormal heartbeat  during procedure

## 2013-11-30 ENCOUNTER — Encounter (HOSPITAL_COMMUNITY): Payer: Self-pay | Admitting: Internal Medicine

## 2013-12-04 ENCOUNTER — Encounter: Payer: Self-pay | Admitting: Internal Medicine

## 2013-12-04 ENCOUNTER — Telehealth: Payer: Self-pay

## 2013-12-04 NOTE — Telephone Encounter (Signed)
Letter from: Corbin Ade  Reason for Letter: Results Review  Send letter to patient.  Send copy of letter with path to referring provider and PCP.   Patient needs a consultation with a general surgeon? Dr. Franky Macho for consideration of hernia repair.

## 2013-12-05 ENCOUNTER — Other Ambulatory Visit: Payer: Self-pay | Admitting: Internal Medicine

## 2013-12-05 DIAGNOSIS — K439 Ventral hernia without obstruction or gangrene: Secondary | ICD-10-CM

## 2013-12-05 NOTE — Telephone Encounter (Signed)
Results Cc to PCP and Referral has been made to Dr. Jenkins 

## 2013-12-12 LAB — COMPREHENSIVE METABOLIC PANEL
ALT: 14 U/L (ref 7–35)
AST: 11 U/L
Alkaline Phosphatase: 47 U/L
Amylase: 29 units/L (ref 25–110)
Total Bilirubin: 0.4 mg/dL

## 2013-12-12 LAB — CBC
HEMATOCRIT: 34 %
HGB: 11.3 g/dL
WBC: 11.6

## 2014-02-18 ENCOUNTER — Other Ambulatory Visit (HOSPITAL_COMMUNITY): Payer: BC Managed Care – PPO

## 2014-02-22 ENCOUNTER — Ambulatory Visit (HOSPITAL_COMMUNITY): Admission: RE | Admit: 2014-02-22 | Payer: BC Managed Care – PPO | Source: Ambulatory Visit | Admitting: General Surgery

## 2014-02-22 ENCOUNTER — Encounter (HOSPITAL_COMMUNITY): Admission: RE | Payer: Self-pay | Source: Ambulatory Visit

## 2014-02-22 SURGERY — REPAIR, HERNIA, INCISIONAL
Anesthesia: General

## 2015-10-02 ENCOUNTER — Encounter: Payer: Self-pay | Admitting: Adult Health

## 2015-10-02 ENCOUNTER — Ambulatory Visit (INDEPENDENT_AMBULATORY_CARE_PROVIDER_SITE_OTHER): Payer: 59 | Admitting: Adult Health

## 2015-10-02 VITALS — BP 144/80 | HR 78 | Ht <= 58 in | Wt 236.0 lb

## 2015-10-02 DIAGNOSIS — Z1211 Encounter for screening for malignant neoplasm of colon: Secondary | ICD-10-CM

## 2015-10-02 DIAGNOSIS — Z01419 Encounter for gynecological examination (general) (routine) without abnormal findings: Secondary | ICD-10-CM

## 2015-10-02 DIAGNOSIS — Z01411 Encounter for gynecological examination (general) (routine) with abnormal findings: Secondary | ICD-10-CM | POA: Diagnosis not present

## 2015-10-02 DIAGNOSIS — K439 Ventral hernia without obstruction or gangrene: Secondary | ICD-10-CM | POA: Diagnosis not present

## 2015-10-02 HISTORY — DX: Ventral hernia without obstruction or gangrene: K43.9

## 2015-10-02 LAB — HEMOCCULT GUIAC POC 1CARD (OFFICE): Fecal Occult Blood, POC: NEGATIVE

## 2015-10-02 NOTE — Patient Instructions (Signed)
Physical in 1 year Mammogram now and yearly [phone] Call Dr Lovell Sheehan for consult regarding hernia

## 2015-10-02 NOTE — Progress Notes (Signed)
Patient ID: [name], female   DOB: 1971/01/01, 45 y.o.   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 PCP is Dr Juanetta Gosling.   Current Medications, Allergies, Past Medical History, Past Surgical History, Family History and Social History were reviewed in [employer] record.     Review of Systems: Patient denies any headaches, hearing loss, fatigue, blurred vision, shortness of breath, chest pain, abdominal pain, problems with bowel movements, urination, or intercourse(not having sex). No joint pain or mood swings.    Physical Exam:BP 144/80 mmHg  Pulse 78  Ht 4' 7.5" (1.41 m)  Wt 236 lb (107.049 kg)  BMI 53.84 kg/m2 General:  Well developed, well nourished, no acute distress Skin:  Warm and dry Neck:  Midline trachea, normal thyroid, good ROM, no lymphadenopathy Lungs; Clear to auscultation bilaterally Breast:  No dominant palpable mass, retraction, or nipple discharge Cardiovascular: Regular rate and rhythm Abdomen:  Soft, non tender, no hepatosplenomegaly, has right ventral hernia Pelvic:  External genitalia is normal in appearance, no lesions.  The vagina is normal in appearance. Urethra has no lesions or masses. The cervix and uterus are absent.  No adnexal masses or tenderness noted.Bladder is non tender, no masses felt. Rectal: Good sphincter tone, no polyps, or hemorrhoids felt.  Hemoccult negative. Extremities/musculoskeletal:  No swelling or varicosities noted, no clubbing or cyanosis Psych:  No mood changes, alert and cooperative,seems happy   Impression: Well woman gyn exam no pap Ventral hernia     Plan: Physical in 1 year Mammogram now and yearly  Call Dr Lovell Sheehan about hernia, she is aware if has pain to go to ER

## 2015-10-12 ENCOUNTER — Encounter (HOSPITAL_COMMUNITY): Payer: Self-pay | Admitting: *Deleted

## 2015-10-12 ENCOUNTER — Emergency Department (HOSPITAL_COMMUNITY)
Admission: EM | Admit: 2015-10-12 | Discharge: 2015-10-12 | Disposition: A | Discharge status: Home / Self Care | Payer: 59 | Attending: Emergency Medicine | Admitting: Emergency Medicine

## 2015-10-12 DIAGNOSIS — E876 Hypokalemia: Secondary | ICD-10-CM | POA: Diagnosis not present

## 2015-10-12 DIAGNOSIS — Z79899 Other long term (current) drug therapy: Secondary | ICD-10-CM | POA: Insufficient documentation

## 2015-10-12 DIAGNOSIS — Z90710 Acquired absence of both cervix and uterus: Secondary | ICD-10-CM | POA: Insufficient documentation

## 2015-10-12 DIAGNOSIS — K432 Incisional hernia without obstruction or gangrene: Secondary | ICD-10-CM | POA: Insufficient documentation

## 2015-10-12 DIAGNOSIS — R109 Unspecified abdominal pain: Secondary | ICD-10-CM | POA: Diagnosis present

## 2015-10-12 LAB — URINALYSIS, ROUTINE W REFLEX MICROSCOPIC
BILIRUBIN URINE: NEGATIVE
GLUCOSE, UA: NEGATIVE mg/dL
HGB URINE DIPSTICK: NEGATIVE
Ketones, ur: NEGATIVE mg/dL
Leukocytes, UA: NEGATIVE
Nitrite: NEGATIVE
PH: 7.5 (ref 5.0–8.0)
Protein, ur: NEGATIVE mg/dL
SPECIFIC GRAVITY, URINE: 1.01 (ref 1.005–1.030)

## 2015-10-12 LAB — CBC WITH DIFFERENTIAL/PLATELET
Basophils Absolute: 0 10*3/uL (ref 0.0–0.1)
Basophils Relative: 0 %
EOS ABS: 0.1 10*3/uL (ref 0.0–0.7)
EOS PCT: 0 %
HCT: 38.8 % (ref 36.0–46.0)
Hemoglobin: 12.5 g/dL (ref 12.0–15.0)
LYMPHS ABS: 1.8 10*3/uL (ref 0.7–4.0)
LYMPHS PCT: 11 %
MCH: 28.9 pg (ref 26.0–34.0)
MCHC: 32.2 g/dL (ref 30.0–36.0)
MCV: 89.8 fL (ref 78.0–100.0)
Monocytes Absolute: 0.8 10*3/uL (ref 0.1–1.0)
Monocytes Relative: 5 %
Neutro Abs: 13.3 10*3/uL — ABNORMAL HIGH (ref 1.7–7.7)
Neutrophils Relative %: 84 %
PLATELETS: 333 10*3/uL (ref 150–400)
RBC: 4.32 MIL/uL (ref 3.87–5.11)
RDW: 15.1 % (ref 11.5–15.5)
WBC: 16 10*3/uL — ABNORMAL HIGH (ref 4.0–10.5)

## 2015-10-12 LAB — COMPREHENSIVE METABOLIC PANEL
ALT: 17 U/L (ref 14–54)
AST: 17 U/L (ref 15–41)
Albumin: 4.1 g/dL (ref 3.5–5.0)
Alkaline Phosphatase: 56 U/L (ref 38–126)
Anion gap: 8 (ref 5–15)
BUN: 7 mg/dL (ref 6–20)
CHLORIDE: 101 mmol/L (ref 101–111)
CO2: 28 mmol/L (ref 22–32)
Calcium: 8.7 mg/dL — ABNORMAL LOW (ref 8.9–10.3)
Creatinine, Ser: 0.64 mg/dL (ref 0.44–1.00)
GFR calc Af Amer: >60 mL/min (ref >60)
GFR calc non Af Amer: >60 mL/min (ref >60)
Glucose, Bld: 116 mg/dL — ABNORMAL HIGH (ref 65–99)
Potassium: 3.3 mmol/L — ABNORMAL LOW (ref 3.5–5.1)
SODIUM: 137 mmol/L (ref 135–145)
Total Bilirubin: 0.5 mg/dL (ref 0.3–1.2)
Total Protein: 8.4 g/dL — ABNORMAL HIGH (ref 6.5–8.1)

## 2015-10-12 MED ORDER — OXYCODONE-ACETAMINOPHEN 5-325 MG PO TABS: 1.0000 | ORAL_TABLET | ORAL | Status: DC | PRN

## 2015-10-12 MED ORDER — POTASSIUM CHLORIDE CRYS ER 20 MEQ PO TBCR: 20.0000 meq | EXTENDED_RELEASE_TABLET | Freq: Two times a day (BID) | ORAL | Status: DC

## 2015-10-12 MED ORDER — MORPHINE SULFATE (PF) 2 MG/ML IV SOLN
4.0000 mg | Freq: Once | INTRAVENOUS | Status: AC
  Administered 2015-10-12: 4 mg via INTRAVENOUS
  Filled 2015-10-12: qty 2

## 2015-10-12 MED ORDER — SODIUM CHLORIDE 0.9 % IV SOLN
INTRAVENOUS | Status: DC
  Administered 2015-10-12: 05:00:00 via INTRAVENOUS

## 2015-10-12 MED ORDER — ONDANSETRON HCL 4 MG/2ML IJ SOLN
4.0000 mg | Freq: Once | INTRAMUSCULAR | Status: AC
  Administered 2015-10-12: 4 mg via INTRAVENOUS
  Filled 2015-10-12: qty 2

## 2015-10-12 MED ORDER — MORPHINE SULFATE (PF) 2 MG/ML IV SOLN
INTRAVENOUS | Status: AC
  Filled 2015-10-12: qty 1

## 2015-10-12 NOTE — ED Provider Notes (Signed)
CSN: [id number]     Arrival date & time 10/12/15  0358 History   First MD Initiated Contact with Patient 10/12/15 0440   Chief Complaint  Patient presents with  . Abdominal Pain     (Consider location/radiation/quality/duration/timing/severity/associated sxs/prior Treatment) HPI patient reports she has a right abdominal hernia that she's had for the past 3 years. She states she was evaluated by Dr. Lovell Sheehan however she decided to wait for surgery. She states it will pop out 2-3 times a month but normally she just lays down and it goes away. She usually has vomiting when it pops out. She states about 11:00 PM tonight the hernia popped out and she started having nausea and vomiting about 6 or 7 times. She denies diarrhea or fever. She states nothing she does makes her hurt worse, nothing she does makes it feel better. She feels like her knee still feels soft.   PCP Dr Juanetta Gosling  Past Medical History  Diagnosis Date  . Medical history non-contributory   . Anemia   . Hernia, inguinal   . Ventral hernia 10/02/2015   Past Surgical History  Procedure Laterality Date  . Abdominal hysterectomy    . Tonsillectomy    . Eye surgery    . Cesarean section      x 2  . Esophagogastroduodenoscopy N/A 11/28/2013    Procedure: ESOPHAGOGASTRODUODENOSCOPY (EGD);  Surgeon: Corbin Ade, MD;  Location: [facility];  Service: Endoscopy;  Laterality: N/A;  12:15   Family History  Problem Relation Age of Onset  . Colon cancer Neg Hx   . Cancer Mother     lung  . Other Father     was shot and killed  . Hyperlipidemia Sister   . Dementia Maternal Grandmother   . Heart attack Maternal Grandfather    Social History  Substance Use Topics  . Smoking status: Never Smoker   . Smokeless tobacco: Never Used  . Alcohol Use: Yes     Comment: occ   employed  OB History    Gravida Para Term Preterm AB TAB SAB Ectopic Multiple Living   4 2  1 1   1  2      Review of Systems  All other systems reviewed  and are negative.     Allergies  Other  Home Medications   Prior to Admission medications   Medication Sig Start Date End Date Taking? Authorizing Provider  esomeprazole (NEXIUM) 40 MG capsule Take 40 mg by mouth daily at 12 noon.   Yes Historical Provider, MD  IRON PO Take by mouth daily.   Yes Historical Provider, MD  MAGNESIUM PO Take by mouth daily.   Yes Historical Provider, MD  Probiotic Product (PROBIOTIC PO) Take by mouth daily.   Yes Historical Provider, MD  oxyCODONE-acetaminophen (PERCOCET/ROXICET) 5-325 MG tablet Take 1-2 tablets by mouth every 4 (four) hours as needed for severe pain. 10/12/15   Devoria Albe, MD  potassium chloride SA (K-DUR,KLOR-CON) 20 MEQ tablet Take 1 tablet (20 mEq total) by mouth 2 (two) times daily. 10/12/15   Devoria Albe, MD   BP 179/92 mmHg  Pulse 89  Temp(Src) 98.3 F (36.8 C) (Oral)  Resp 22  Ht 4' 7.5" (1.41 m)  Wt 236 lb (107.049 kg)  BMI 53.84 kg/m2  SpO2 96%  Vital signs normal   Physical Exam  Constitutional: She is oriented to person, place, and time. She appears well-developed and well-nourished.  Non-toxic appearance. She does not appear ill. She appears  distressed.  HENT:  Head: Normocephalic and atraumatic.  Right Ear: External ear normal.  Left Ear: External ear normal.  Nose: Nose normal. No mucosal edema or rhinorrhea.  Mouth/Throat: Oropharynx is clear and moist and mucous membranes are normal. No dental abscesses or uvula swelling.  Eyes: Conjunctivae and EOM are normal. Pupils are equal, round, and reactive to light.  Neck: Normal range of motion and full passive range of motion without pain. Neck supple.  Cardiovascular: Normal rate, regular rhythm and normal heart sounds.  Exam reveals no gallop and no friction rub.   No murmur heard. Pulmonary/Chest: Effort normal and breath sounds normal. No respiratory distress. She has no wheezes. She has no rhonchi. She has no rales. She exhibits no tenderness and no crepitus.    Abdominal: Soft. Normal appearance and bowel sounds are normal. She exhibits mass. She exhibits no distension. There is no tenderness. There is no rebound and no guarding.    Patient has a bulging in her right abdomen as shown on the drawing that is firm to touch. She seems more painful when she tries to lay flat.  Musculoskeletal: Normal range of motion. She exhibits no edema or tenderness.  Moves all extremities well.   Neurological: She is alert and oriented to person, place, and time. She has normal strength. No cranial nerve deficit.  Skin: Skin is warm, dry and intact. No rash noted. No erythema. No pallor.  Psychiatric: She has a normal mood and affect. Her speech is normal and behavior is normal. Her mood appears not anxious.  Nursing note and vitals reviewed.   ED Course  Procedures (including critical care time)  Medications  0.9 %  sodium chloride infusion ( Intravenous New Bag/Given 10/12/15 0515)  morphine 2 MG/ML injection 4 mg (4 mg Intravenous Given 10/12/15 0521)  ondansetron (ZOFRAN) injection 4 mg (4 mg Intravenous Given 10/12/15 0517)   Patient had IV inserted, she was given Zofran for nausea. I had ordered 4 mg of morphine for pain so I could reduce her hernia however she refused initially to take any pain medicine for high encouraged her to take it as it will help to reduce her hernia if she is more relaxed. She did consent to taking 2 mg  5:40 AM patient placed in Trendelenburg position with her head flat. With constant gentle pressure with my flat hand her hernia was reduced. It felt like it was trying to come out again and I had to use more gentle flat pressure and finally it was all reduced. She states she is having some pain in her right upper quadrant. She was given additional 2 mg of morphine.  06:05 pt was rechecked, the hernia has not reapeared. She was laid flat out of the trendelenburg position.   06:40 AM Pt ambulated to the bathroom.   Recheck at 07:00AM her  hernia remains reduced. She states she is painfree. We discussed to not do any heavy lifting or pulling and to follow-up with Dr. Lovell Sheehan and to consider having surgical repair of her hernia.  Labs Review Results for orders placed or performed during the hospital encounter of 10/12/15  Comprehensive metabolic panel  Result Value Ref Range   Sodium 137 135 - 145 mmol/L   Potassium 3.3 (L) 3.5 - 5.1 mmol/L   Chloride 101 101 - 111 mmol/L   CO2 28 22 - 32 mmol/L   Glucose, Bld 116 (H) 65 - 99 mg/dL   BUN 7 6 - 20 mg/dL  Creatinine, Ser 0.64 0.44 - 1.00 mg/dL   Calcium 8.7 (L) 8.9 - 10.3 mg/dL   Total Protein 8.4 (H) 6.5 - 8.1 g/dL   Albumin 4.1 3.5 - 5.0 g/dL   AST 17 15 - 41 U/L   ALT 17 14 - 54 U/L   Alkaline Phosphatase 56 38 - 126 U/L   Total Bilirubin 0.5 0.3 - 1.2 mg/dL   GFR calc non Af Amer >60 >60 mL/min   GFR calc Af Amer >60 >60 mL/min   Anion gap 8 5 - 15  CBC with Differential  Result Value Ref Range   WBC 16.0 (H) 4.0 - 10.5 K/uL   RBC 4.32 3.87 - 5.11 MIL/uL   Hemoglobin 12.5 12.0 - 15.0 g/dL   HCT 16.1 09.6 - 04.5 %   MCV 89.8 78.0 - 100.0 fL   MCH 28.9 26.0 - 34.0 pg   MCHC 32.2 30.0 - 36.0 g/dL   RDW 40.9 81.1 - 91.4 %   Platelets 333 150 - 400 K/uL   Neutrophils Relative % 84 %   Neutro Abs 13.3 (H) 1.7 - 7.7 K/uL   Lymphocytes Relative 11 %   Lymphs Abs 1.8 0.7 - 4.0 K/uL   Monocytes Relative 5 %   Monocytes Absolute 0.8 0.1 - 1.0 K/uL   Eosinophils Relative 0 %   Eosinophils Absolute 0.1 0.0 - 0.7 K/uL   Basophils Relative 0 %   Basophils Absolute 0.0 0.0 - 0.1 K/uL  Urinalysis, Routine w reflex microscopic  Result Value Ref Range   Color, Urine YELLOW YELLOW   APPearance CLEAR CLEAR   Specific Gravity, Urine 1.010 1.005 - 1.030   pH 7.5 5.0 - 8.0   Glucose, UA NEGATIVE NEGATIVE mg/dL   Hgb urine dipstick NEGATIVE NEGATIVE   Bilirubin Urine NEGATIVE NEGATIVE   Ketones, ur NEGATIVE NEGATIVE mg/dL   Protein, ur NEGATIVE NEGATIVE mg/dL   Nitrite  NEGATIVE NEGATIVE   Leukocytes, UA NEGATIVE NEGATIVE   Laboratory interpretation all normal except leukocytosis, mild hypokalemia   I have personally reviewed and evaluated these images and lab results as part of my medical decision-making.    MDM   Final diagnoses:  Hypokalemia  Recurrent ventral hernia    New Prescriptions   OXYCODONE-ACETAMINOPHEN (PERCOCET/ROXICET) 5-325 MG TABLET    Take 1-2 tablets by mouth every 4 (four) hours as needed for severe pain.   POTASSIUM CHLORIDE SA (K-DUR,KLOR-CON) 20 MEQ TABLET    Take 1 tablet (20 mEq total) by mouth 2 (two) times daily.    Plan discharge  Devoria Albe, MD, Concha Pyo, MD 10/12/15 [phone]

## 2015-10-12 NOTE — Discharge Instructions (Signed)
No heavy lifting or straining. Take the pain medication if the hernia comes out again. You need to lay down flat and have your legs elevated and place firm constant pressure with the flat of your hand until the hernia pops back in. If it does not pop back in a need to come to the ED. Consider discussing repair view hernia with Dr. Lovell Sheehan.   Hypokalemia Hypokalemia means that the amount of potassium in the blood is lower than normal.Potassium is a chemical, called an electrolyte, that helps regulate the amount of fluid in the body. It also stimulates muscle contraction and helps nerves function properly.Most of the body's potassium is inside of cells, and only a very small amount is in the blood. Because the amount in the blood is so small, minor changes can be life-threatening. CAUSES  Antibiotics.  Diarrhea or vomiting.  Using laxatives too much, which can cause diarrhea.  Chronic kidney disease.  Water pills (diuretics).  Eating disorders (bulimia).  Low magnesium level.  Sweating a lot. SIGNS AND SYMPTOMS  Weakness.  Constipation.  Fatigue.  Muscle cramps.  Mental confusion.  Skipped heartbeats or irregular heartbeat (palpitations).  Tingling or numbness. DIAGNOSIS  Your health care provider can diagnose hypokalemia with blood tests. In addition to checking your potassium level, your health care provider may also check other lab tests. TREATMENT Hypokalemia can be treated with potassium supplements taken by mouth or adjustments in your current medicines. If your potassium level is very low, you may need to get potassium through a vein (IV) and be monitored in the hospital. A diet high in potassium is also helpful. Foods high in potassium are:  Nuts, such as peanuts and pistachios.  Seeds, such as sunflower seeds and pumpkin seeds.  Peas, lentils, and lima beans.  Whole grain and bran cereals and breads.  Fresh fruit and vegetables, such as apricots, avocado,  bananas, cantaloupe, kiwi, oranges, tomatoes, asparagus, and potatoes.  Orange and tomato juices.  Red meats.  Fruit yogurt. HOME CARE INSTRUCTIONS  Take all medicines as prescribed by your health care provider.  Maintain a healthy diet by including nutritious food, such as fruits, vegetables, nuts, whole grains, and lean meats.  If you are taking a laxative, be sure to follow the directions on the label. SEEK MEDICAL CARE IF:  Your weakness gets worse.  You feel your heart pounding or racing.  You are vomiting or having diarrhea.  You are diabetic and having trouble keeping your blood glucose in the normal range. SEEK IMMEDIATE MEDICAL CARE IF:  You have chest pain, shortness of breath, or dizziness.  You are vomiting or having diarrhea for more than 2 days.  You faint. MAKE SURE YOU:   Understand these instructions.  Will watch your condition.  Will get help right away if you are not doing well or get worse.   This information is not intended to replace advice given to you by your health care provider. Make sure you discuss any questions you have with your health care provider.   Document Released: 07/26/2005 Document Revised: 08/16/2014 Document Reviewed: 01/26/2013 Elsevier Interactive Patient Education 2016 Else[employer] Inc[employer]  Hernia, Adult A hernia is the bulging of an organ or tissue through a weak spot in the muscles of the abdomen (abdominal wall). Hernias develop most often near the navel or groin. There are many kinds of hernias. Common kinds include:  Femoral hernia. This kind of hernia develops under the groin in the upper thigh area.  Inguinal hernia.  This kind of hernia develops in the groin or scrotum.  Umbilical hernia. This kind of hernia develops near the navel.  Hiatal hernia. This kind of hernia causes part of the stomach to be pushed up into the chest.  Incisional hernia. This kind of hernia bulges through a scar from an abdominal  surgery. CAUSES This condition may be caused by:  Heavy lifting.  Coughing over a long period of time.  Straining to have a bowel movement.  An incision made during an abdominal surgery.  A birth defect (congenital defect).  Excess weight or obesity.  Smoking.  Poor nutrition.  Cystic fibrosis.  Excess fluid in the abdomen.  Undescended testicles. SYMPTOMS Symptoms of a hernia include:  A lump on the abdomen. This is the first sign of a hernia. The lump may become more obvious with standing, straining, or coughing. It may get bigger over time if it is not treated or if the condition causing it is not treated.  Pain. A hernia is usually painless, but it may become painful over time if treatment is delayed. The pain is usually dull and may get worse with standing or lifting heavy objects. Sometimes a hernia gets tightly squeezed in the weak spot (strangulated) or stuck there (incarcerated) and causes additional symptoms. These symptoms may include:  Vomiting.  Nausea.  Constipation.  Irritability. DIAGNOSIS A hernia may be diagnosed with:  A physical exam. During the exam your health care provider may ask you to cough or to make a specific movement, because a hernia is usually more visible when you move.  Imaging tests. These can include:  X-rays.  Ultrasound.  CT scan. TREATMENT A hernia that is small and painless may not need to be treated. A hernia that is large or painful may be treated with surgery. Inguinal hernias may be treated with surgery to prevent incarceration or strangulation. Strangulated hernias are always treated with surgery, because lack of blood to the trapped organ or tissue can cause it to die. Surgery to treat a hernia involves pushing the bulge back into place and repairing the weak part of the abdomen. HOME CARE INSTRUCTIONS  Avoid straining.  Do not lift anything heavier than 10 lb (4.5 kg).  Lift with your leg muscles, not your  back muscles. This helps avoid strain.  When coughing, try to cough gently.  Prevent constipation. Constipation leads to straining with bowel movements, which can make a hernia worse or cause a hernia repair to break down. You can prevent constipation by:  Eating a high-fiber diet that includes plenty of fruits and vegetables.  Drinking enough fluids to keep your urine clear or pale yellow. Aim to drink 6-8 glasses of water per day.  Using a stool softener as directed by your health care provider.  Lose weight, if you are overweight.  Do not use any tobacco products, including cigarettes, chewing tobacco, or electronic cigarettes. If you need help quitting, ask your health care provider.  Keep all follow-up visits as directed by your health care provider. This is important. Your health care provider may need to monitor your condition. SEEK MEDICAL CARE IF:  You have swelling, redness, and pain in the affected area.  Your bowel habits change. SEEK IMMEDIATE MEDICAL CARE IF:  You have a fever.  You have abdominal pain that is getting worse.  You feel nauseous or you vomit.  You cannot push the hernia back in place by gently pressing on it while you are lying down.  The  hernia:  Changes in shape or size.  Is stuck outside the abdomen.  Becomes discolored.  Feels hard or tender.   This information is not intended to replace advice given to you by your health care provider. Make sure you discuss any questions you have with your health care provider.   Document Released: 07/26/2005 Document Revised: 08/16/2014 Document Reviewed: 06/05/2014 Elsevier Interactive Patient Education [employer]

## 2015-10-12 NOTE — ED Notes (Signed)
[  pt states that she has a hernia that started hurting last night with vomiting, advises that she is suppose to have surgery but has been "putting it off" for the past 3 years,

## 2015-10-16 ENCOUNTER — Other Ambulatory Visit: Payer: Self-pay | Admitting: Obstetrics and Gynecology

## 2015-10-16 DIAGNOSIS — Z1231 Encounter for screening mammogram for malignant neoplasm of breast: Secondary | ICD-10-CM

## 2015-10-30 ENCOUNTER — Ambulatory Visit (HOSPITAL_COMMUNITY)
Admission: RE | Admit: 2015-10-30 | Discharge: 2015-10-30 | Disposition: A | Discharge status: Home / Self Care | Payer: 59 | Source: Ambulatory Visit | Attending: Obstetrics and Gynecology | Admitting: Obstetrics and Gynecology

## 2015-10-30 DIAGNOSIS — Z1231 Encounter for screening mammogram for malignant neoplasm of breast: Secondary | ICD-10-CM | POA: Diagnosis not present

## 2015-11-05 NOTE — H&P (Signed)
  NTS SOAP Note  Vital Signs:  Vitals as of: 10/30/2015: Systolic 160: Diastolic 90: Heart Rate 71: Temp 99.25F (Temporal): Height 58ft 7.5in: Weight 236Lbs 0 Ounces: BMI 53.87   BMI : 53.87 kg/m2  Subjective: This 45 year old female presents for of an incisional hernia.  Has a known hernia.  Seems to be increasing in size.  Causing her discomfort.  No vomiting.   Review of Symptoms:  Constitutional:fatigue Head:unremarkable Eyes:unremarkable   sinus problems Cardiovascular:  unremarkable Respiratory:unremarkable Gastrointestinabdominal pain, nausea, heartburn Genitourinary:unremarkable   Musculoskeletal:unremarkable Skin:unremarkable Hematolgic/Lymphatic:unremarkable   Allergic/Immunologic:unremarkable   Past Medical History:  Reviewed  Past Medical History  Surgical History: umbilical herniorrhaphy in remote past, hysterectomy, c section Medical Problems: reflux Allergies: nkda Medications: nexium otc, zyrtec otc   Social History:Reviewed  Social History  Preferred Language: English Race:  Black or African American Ethnicity: Not Hispanic / Latino Age: 43 Years 4 Months Marital Status:  S Alcohol: rarely Recreational drug(s): no   Smoking Status: Never smoker reviewed on 10/30/2015 Functional Status reviewed on 10/30/2015 ------------------------------------------------ Bathing: Normal Cooking: Normal Dressing: Normal Driving: Normal Eating: Normal Managing Meds: Normal Oral Care: Normal Shopping: Normal Toileting: Normal Transferring: Normal Walking: Normal Cognitive Status reviewed on 10/30/2015 ------------------------------------------------ Attention: Normal Decision Making: Normal Language: Normal Memory: Normal Motor: Normal Perception: Normal Problem Solving: Normal Visual and Spatial: Normal   Family History:Reviewed  Family Health History Mother, Deceased; Hypertension (high blood pressure);  Father, Deceased;  Hypertension (high blood pressure);     Objective Information: General:Well appearing, well nourished in no distress. Heart:RRR, no murmur or gallop.  Normal S1, S2.  No S3, S4.  Lungs:  CTA bilaterally, no wheezes, rhonchi, rales.  Breathing unlabored. Abdomen:Soft, NT/ND, no HSM, no masses.  Incisional hernia noted to the right of the umbilicus, reducible.  Assessment:Incisional hernia  Diagnoses: 553.21  K43.2 Incisional hernia (Incisional hernia without obstruction or gangrene)  Procedures: 09407 - OFFICE OUTPATIENT VISIT 25 MINUTES    Plan:  Will call to schedule incisional herniorrhaphy with mesh.   Patient Education:Alternative treatments to surgery were discussed with patient (and family).  Risks and benefits  of procedure including bleeding, infection, mesh use, and the possibility of recurrence of the hernia were fully explained to the patient (and family) who gave informed consent. Patient/family questions were addressed.  Follow-up:Pending Surgery

## 2015-11-17 NOTE — Patient Instructions (Signed)
[name]  11/17/2015     @   Your procedure is scheduled on  11/24/2015   Report to [facility] at  615   A.M.  Call this number if you have problems the morning of surgery:  [phone]   Remember:  Do not eat food or drink liquids after midnight.  Take these medicines the morning of surgery with A SIP OF WATER   Nexium, percocet.   Do not wear jewelry, make-up or nail polish.  Do not wear lotions, powders, or perfumes.  You may wear deodorant.  Do not shave 48 hours prior to surgery.  Men may shave face and neck.  Do not bring valuables to the hospital.  [facility] is not responsible for any belongings or valuables.  Contacts, dentures or bridgework may not be worn into surgery.  Leave your suitcase in the car.  After surgery it may be brought to your room.  For patients admitted to the hospital, discharge time will be determined by your treatment team.  Patients discharged the day of surgery will not be allowed to drive home.   Name and phone number of your driver:   family Special instructions:  none  Please read over the following fact sheets that you were given. Coughing and Deep Breathing, Surgical Site Infection Prevention, Anesthesia Post-op Instructions and Care and Recovery After Surgery      Open Hernia Repair, Care After Refer to this sheet in the next few weeks. These instructions provide you with information on caring for yourself after your procedure. Your health care provider may also give you more specific instructions. Your treatment has been planned according to current medical practices, but problems sometimes occur. Call your health care provider if you have any problems or questions after your procedure. WHAT TO EXPECT AFTER THE PROCEDURE After your procedure, it is typical to have the following:  Pain in your abdomen, especially along your incision. You will be given pain medicines to control the  pain.  Constipation. You may be given a stool softener to help prevent this. HOME CARE INSTRUCTIONS  Only take over-the-counter or prescription medicines as directed by your health care provider.  Keep the incision area dry and clean. You may wash the incision area gently with soap and water 48 hours after surgery. Gently blot or dab the incision area dry. Do not take baths, use swimming pools, or use hot tubs for 10 days or until your health care provider approves.  Change bandages (dressings) as directed by your health care provider.  Continue your normal diet as directed by your health care provider. Eat plenty of fruits and vegetables to help prevent constipation.  Drink enough fluids to keep your urine clear or pale yellow. This also helps prevent constipation.  Do not drive until your health care provider says it is okay.  Do not lift anything heavier than 10 lb (4.5 kg) or play contact sports for 4 weeks or until your health care provider approves.  Follow up with your health care provider as directed. Ask your health care provider when to make an appointment to have your stitches (sutures) or staples removed. SEEK MEDICAL CARE IF:  You have increased bleeding coming from the incision site.  You have blood in your stool.  You have increasing pain in the incision area.  You see redness or swelling in the incision area.  You have fluid (pus) coming from the incision.  You have a fever.  You notice a bad smell coming from the incision area or dressing. SEEK IMMEDIATE MEDICAL CARE IF:  You develop a rash.  You have chest pain or shortness of breath.  You feel lightheaded or feel faint.   This information is not intended to replace advice given to you by your health care provider. Make sure you discuss any questions you have with your health care provider.   Document Released: 02/12/2005 Document Revised: 08/16/2014 Document Reviewed: 03/07/2013 Elsevier Interactive  Patient Education 2016 Elsevier Inc. Hernia A hernia happens when an organ or tissue inside your body pushes out through a weak spot in the belly (abdomen). HOME CARE  Avoid stretching or overusing (straining) the muscles near the hernia.  Do not lift anything heavier than 10 lb (4.5 kg).  Use the muscles in your leg when you lift something up. Do not use the muscles in your back.  When you cough, try to cough gently.  Eat a diet that has a lot of fiber. Eat lots of fruits and vegetables.  Drink enough fluids to keep your pee (urine) clear or pale yellow. Try to drink 6-8 glasses of water a day.  Take medicines to make your poop soft (stool softeners) as told by your doctor.  Lose weight, if you are overweight.  Do not use any tobacco products, including cigarettes, chewing tobacco, or electronic cigarettes. If you need help quitting, ask your doctor.  Keep all follow-up visits as told by your doctor. This is important. GET HELP IF:  The skin by the hernia gets puffy (swollen) or red.  The hernia is painful. GET HELP RIGHT AWAY IF:  You have a fever.  You have belly pain that is getting worse.  You feel sick to your stomach (nauseous) or you throw up (vomit).  You cannot push the hernia back in place by gently pressing on it while you are lying down.  The hernia:  Changes in shape or size.  Is stuck outside your belly.  Changes color.  Feels hard or tender.   This information is not intended to replace advice given to you by your health care provider. Make sure you discuss any questions you have with your health care provider.   Document Released: 01/13/2010 Document Revised: 08/16/2014 Document Reviewed: 06/05/2014 Elsevier Interactive Patient Education 2016 Else[employer] Inc[employer] PATIENT INSTRUCTIONS POST-ANESTHESIA  IMMEDIATELY FOLLOWING SURGERY:  Do not drive or operate machinery for the first twenty four hours after surgery.  Do not make any important decisions  for twenty four hours after surgery or while taking narcotic pain medications or sedatives.  If you develop intractable nausea and vomiting or a severe headache please notify your doctor immediately.  FOLLOW-UP:  Please make an appointment with your surgeon as instructed. You do not need to follow up with anesthesia unless specifically instructed to do so.  WOUND CARE INSTRUCTIONS (if applicable):  Keep a dry clean dressing on the anesthesia/puncture wound site if there is drainage.  Once the wound has quit draining you may leave it open to air.  Generally you should leave the bandage intact for twenty four hours unless there is drainage.  If the epidural site drains for more than 36-48 hours please call the anesthesia department.  QUESTIONS?:  Please feel free to call your physician or the hospital operator if you have any questions, and they will be happy to assist you.

## 2015-11-18 ENCOUNTER — Encounter (HOSPITAL_COMMUNITY): Payer: Self-pay

## 2015-11-18 ENCOUNTER — Encounter (HOSPITAL_COMMUNITY)
Admission: RE | Admit: 2015-11-18 | Discharge: 2015-11-18 | Disposition: A | Discharge status: Home / Self Care | Payer: 59 | Source: Ambulatory Visit | Attending: General Surgery | Admitting: General Surgery

## 2015-11-18 DIAGNOSIS — Z01812 Encounter for preprocedural laboratory examination: Secondary | ICD-10-CM | POA: Insufficient documentation

## 2015-11-18 LAB — CBC WITH DIFFERENTIAL/PLATELET
Basophils Absolute: 0 10*3/uL (ref 0.0–0.1)
Basophils Relative: 0 %
EOS ABS: 0.2 10*3/uL (ref 0.0–0.7)
EOS PCT: 2 %
HCT: 34.7 % — ABNORMAL LOW (ref 36.0–46.0)
Hemoglobin: 11.5 g/dL — ABNORMAL LOW (ref 12.0–15.0)
LYMPHS PCT: 32 %
Lymphs Abs: 3.3 10*3/uL (ref 0.7–4.0)
MCH: 29.4 pg (ref 26.0–34.0)
MCHC: 33.1 g/dL (ref 30.0–36.0)
MCV: 88.7 fL (ref 78.0–100.0)
MONO ABS: 0.7 10*3/uL (ref 0.1–1.0)
Monocytes Relative: 7 %
Neutro Abs: 6.1 10*3/uL (ref 1.7–7.7)
Neutrophils Relative %: 59 %
PLATELETS: 323 10*3/uL (ref 150–400)
RBC: 3.91 MIL/uL (ref 3.87–5.11)
RDW: 14.9 % (ref 11.5–15.5)
WBC: 10.3 10*3/uL (ref 4.0–10.5)

## 2015-11-18 LAB — BASIC METABOLIC PANEL
Anion gap: 7 (ref 5–15)
BUN: 8 mg/dL (ref 6–20)
CALCIUM: 8.6 mg/dL — AB (ref 8.9–10.3)
CO2: 26 mmol/L (ref 22–32)
CREATININE: 0.71 mg/dL (ref 0.44–1.00)
Chloride: 106 mmol/L (ref 101–111)
GFR calc Af Amer: >60 mL/min (ref >60)
GLUCOSE: 88 mg/dL (ref 65–99)
POTASSIUM: 3.6 mmol/L (ref 3.5–5.1)
Sodium: 139 mmol/L (ref 135–145)

## 2015-11-24 ENCOUNTER — Ambulatory Visit (HOSPITAL_COMMUNITY)
Admission: RE | Admit: 2015-11-24 | Discharge: 2015-11-24 | Disposition: A | Discharge status: Home / Self Care | Payer: 59 | Source: Ambulatory Visit | Attending: General Surgery | Admitting: General Surgery

## 2015-11-24 ENCOUNTER — Ambulatory Visit (HOSPITAL_COMMUNITY): Payer: 59 | Admitting: Anesthesiology

## 2015-11-24 ENCOUNTER — Encounter (HOSPITAL_COMMUNITY)
Admission: RE | Disposition: A | Discharge status: Home / Self Care | Payer: Self-pay | Source: Ambulatory Visit | Attending: General Surgery

## 2015-11-24 ENCOUNTER — Encounter (HOSPITAL_COMMUNITY): Payer: Self-pay | Admitting: *Deleted

## 2015-11-24 DIAGNOSIS — K430 Incisional hernia with obstruction, without gangrene: Secondary | ICD-10-CM | POA: Diagnosis not present

## 2015-11-24 DIAGNOSIS — K432 Incisional hernia without obstruction or gangrene: Secondary | ICD-10-CM | POA: Diagnosis present

## 2015-11-24 DIAGNOSIS — K219 Gastro-esophageal reflux disease without esophagitis: Secondary | ICD-10-CM | POA: Insufficient documentation

## 2015-11-24 DIAGNOSIS — Z6841 Body Mass Index (BMI) 40.0 and over, adult: Secondary | ICD-10-CM | POA: Insufficient documentation

## 2015-11-24 HISTORY — PX: INCISIONAL HERNIA REPAIR: SHX193

## 2015-11-24 HISTORY — PX: INSERTION OF MESH: SHX5868

## 2015-11-24 SURGERY — REPAIR, HERNIA, INCISIONAL
Anesthesia: General | Site: Abdomen

## 2015-11-24 MED ORDER — ROCURONIUM BROMIDE 100 MG/10ML IV SOLN
INTRAVENOUS | Status: DC | PRN
  Administered 2015-11-24: 5 mg via INTRAVENOUS

## 2015-11-24 MED ORDER — CEFAZOLIN SODIUM-DEXTROSE 2-4 GM/100ML-% IV SOLN
INTRAVENOUS | Status: AC
  Filled 2015-11-24: qty 100

## 2015-11-24 MED ORDER — DEXAMETHASONE SODIUM PHOSPHATE 4 MG/ML IJ SOLN
4.0000 mg | Freq: Once | INTRAMUSCULAR | Status: AC
  Administered 2015-11-24: 4 mg via INTRAVENOUS

## 2015-11-24 MED ORDER — 0.9 % SODIUM CHLORIDE (POUR BTL) OPTIME
TOPICAL | Status: DC | PRN
  Administered 2015-11-24: 1000 mL

## 2015-11-24 MED ORDER — PROPOFOL 10 MG/ML IV BOLUS
INTRAVENOUS | Status: DC | PRN
  Administered 2015-11-24: 150 mg via INTRAVENOUS

## 2015-11-24 MED ORDER — SUCCINYLCHOLINE CHLORIDE 20 MG/ML IJ SOLN
INTRAMUSCULAR | Status: AC
  Filled 2015-11-24: qty 1

## 2015-11-24 MED ORDER — BUPIVACAINE HCL (PF) 0.5 % IJ SOLN
INTRAMUSCULAR | Status: AC
  Filled 2015-11-24: qty 30

## 2015-11-24 MED ORDER — FENTANYL CITRATE (PF) 100 MCG/2ML IJ SOLN
INTRAMUSCULAR | Status: DC | PRN
  Administered 2015-11-24 (×2): 50 ug via INTRAVENOUS
  Administered 2015-11-24: 100 ug via INTRAVENOUS

## 2015-11-24 MED ORDER — DEXAMETHASONE SODIUM PHOSPHATE 4 MG/ML IJ SOLN
INTRAMUSCULAR | Status: AC
  Filled 2015-11-24: qty 1

## 2015-11-24 MED ORDER — ONDANSETRON HCL 4 MG/2ML IJ SOLN
4.0000 mg | Freq: Once | INTRAMUSCULAR | Status: AC
  Administered 2015-11-24: 4 mg via INTRAVENOUS

## 2015-11-24 MED ORDER — ENOXAPARIN SODIUM 40 MG/0.4ML ~~LOC~~ SOLN
SUBCUTANEOUS | Status: AC
  Filled 2015-11-24: qty 0.4

## 2015-11-24 MED ORDER — ENOXAPARIN SODIUM 40 MG/0.4ML ~~LOC~~ SOLN
40.0000 mg | Freq: Once | SUBCUTANEOUS | Status: AC
  Administered 2015-11-24: 40 mg via SUBCUTANEOUS

## 2015-11-24 MED ORDER — KETOROLAC TROMETHAMINE 30 MG/ML IJ SOLN
INTRAMUSCULAR | Status: AC
  Filled 2015-11-24: qty 1

## 2015-11-24 MED ORDER — CHLORHEXIDINE GLUCONATE 4 % EX LIQD: 1.0000 "application " | Freq: Once | CUTANEOUS | Status: DC

## 2015-11-24 MED ORDER — PROPOFOL 10 MG/ML IV BOLUS
INTRAVENOUS | Status: AC
  Filled 2015-11-24: qty 20

## 2015-11-24 MED ORDER — BUPIVACAINE HCL (PF) 0.5 % IJ SOLN
INTRAMUSCULAR | Status: DC | PRN
  Administered 2015-11-24: 10 mL

## 2015-11-24 MED ORDER — MIDAZOLAM HCL 2 MG/2ML IJ SOLN
INTRAMUSCULAR | Status: AC
  Filled 2015-11-24: qty 2

## 2015-11-24 MED ORDER — ONDANSETRON HCL 4 MG/2ML IJ SOLN
INTRAMUSCULAR | Status: AC
  Filled 2015-11-24: qty 2

## 2015-11-24 MED ORDER — LACTATED RINGERS IV SOLN
INTRAVENOUS | Status: DC
  Administered 2015-11-24: 07:00:00 via INTRAVENOUS

## 2015-11-24 MED ORDER — MIDAZOLAM HCL 5 MG/5ML IJ SOLN
INTRAMUSCULAR | Status: DC | PRN
  Administered 2015-11-24: 2 mg via INTRAVENOUS

## 2015-11-24 MED ORDER — SUCCINYLCHOLINE CHLORIDE 20 MG/ML IJ SOLN
INTRAMUSCULAR | Status: DC | PRN
  Administered 2015-11-24: 175 mg via INTRAVENOUS

## 2015-11-24 MED ORDER — GLYCOPYRROLATE 0.2 MG/ML IJ SOLN
INTRAMUSCULAR | Status: AC
  Filled 2015-11-24: qty 1

## 2015-11-24 MED ORDER — ROCURONIUM BROMIDE 50 MG/5ML IV SOLN
INTRAVENOUS | Status: AC
  Filled 2015-11-24: qty 1

## 2015-11-24 MED ORDER — POVIDONE-IODINE 10 % EX OINT
TOPICAL_OINTMENT | CUTANEOUS | Status: AC
  Filled 2015-11-24: qty 1

## 2015-11-24 MED ORDER — FENTANYL CITRATE (PF) 100 MCG/2ML IJ SOLN: 25.0000 ug | INTRAMUSCULAR | Status: DC | PRN

## 2015-11-24 MED ORDER — CEFAZOLIN SODIUM-DEXTROSE 2-4 GM/100ML-% IV SOLN
2.0000 g | INTRAVENOUS | Status: AC
  Administered 2015-11-24: 2 g via INTRAVENOUS

## 2015-11-24 MED ORDER — MIDAZOLAM HCL 2 MG/2ML IJ SOLN
1.0000 mg | INTRAMUSCULAR | Status: DC | PRN
Start: 2015-11-24 — End: 2015-11-24
  Administered 2015-11-24: 2 mg via INTRAVENOUS

## 2015-11-24 MED ORDER — KETOROLAC TROMETHAMINE 30 MG/ML IJ SOLN
30.0000 mg | Freq: Once | INTRAMUSCULAR | Status: AC
  Administered 2015-11-24: 30 mg via INTRAVENOUS

## 2015-11-24 MED ORDER — LIDOCAINE HCL 1 % IJ SOLN
INTRAMUSCULAR | Status: DC | PRN
  Administered 2015-11-24: 25 mg via INTRADERMAL

## 2015-11-24 MED ORDER — FENTANYL CITRATE (PF) 250 MCG/5ML IJ SOLN
INTRAMUSCULAR | Status: AC
  Filled 2015-11-24: qty 5

## 2015-11-24 MED ORDER — LIDOCAINE HCL (PF) 1 % IJ SOLN
INTRAMUSCULAR | Status: AC
  Filled 2015-11-24: qty 5

## 2015-11-24 MED ORDER — OXYCODONE-ACETAMINOPHEN 7.5-325 MG PO TABS: 1.0000 | ORAL_TABLET | ORAL | Status: DC | PRN

## 2015-11-24 MED ORDER — GLYCOPYRROLATE 0.2 MG/ML IJ SOLN
0.2000 mg | Freq: Once | INTRAMUSCULAR | Status: AC
  Administered 2015-11-24: 0.2 mg via INTRAVENOUS

## 2015-11-24 MED ORDER — ONDANSETRON HCL 4 MG/2ML IJ SOLN: 4.0000 mg | Freq: Once | INTRAMUSCULAR | Status: DC | PRN

## 2015-11-24 MED ORDER — POVIDONE-IODINE 10 % OINT PACKET
TOPICAL_OINTMENT | CUTANEOUS | Status: DC | PRN
  Administered 2015-11-24: 1 via TOPICAL

## 2015-11-24 SURGICAL SUPPLY — 49 items
BAG HAMPER (MISCELLANEOUS) ×2 IMPLANT
BLADE SURG SZ11 CARB STEEL (BLADE) ×2 IMPLANT
CHLORAPREP W/TINT 26ML (MISCELLANEOUS) ×2 IMPLANT
CLOTH BEACON ORANGE TIMEOUT ST (SAFETY) ×2 IMPLANT
COVER LIGHT HANDLE STERIS (MISCELLANEOUS) ×4 IMPLANT
DECANTER SPIKE VIAL GLASS SM (MISCELLANEOUS) ×1 IMPLANT
ELECT REM PT RETURN 9FT ADLT (ELECTROSURGICAL) ×2
ELECTRODE REM PT RTRN 9FT ADLT (ELECTROSURGICAL) ×1 IMPLANT
GAUZE SPONGE 4X4 12PLY STRL (GAUZE/BANDAGES/DRESSINGS) ×2 IMPLANT
GLOVE BIO SURGEON STRL SZ7 (GLOVE) ×2 IMPLANT
GLOVE BIOGEL PI IND STRL 7.0 (GLOVE) ×1 IMPLANT
GLOVE BIOGEL PI INDICATOR 7.0 (GLOVE) ×2
GLOVE SURG SS PI 7.5 STRL IVOR (GLOVE) ×3 IMPLANT
GOWN STRL REUS W/ TWL LRG LVL3 (GOWN DISPOSABLE) ×1 IMPLANT
GOWN STRL REUS W/ TWL XL LVL3 (GOWN DISPOSABLE) ×1 IMPLANT
GOWN STRL REUS W/TWL LRG LVL3 (GOWN DISPOSABLE) ×4
GOWN STRL REUS W/TWL XL LVL3 (GOWN DISPOSABLE) ×2
INST SET MAJOR GENERAL (KITS) IMPLANT
INST SET MINOR GENERAL (KITS) ×1 IMPLANT
KIT ROOM TURNOVER APOR (KITS) ×2 IMPLANT
LIGASURE IMPACT 36 18CM CVD LR (INSTRUMENTS) ×1 IMPLANT
MANIFOLD NEPTUNE II (INSTRUMENTS) ×2 IMPLANT
MESH VENTRALEX ST 8CM LRG (Mesh General) ×1 IMPLANT
NDL HYPO 21X1.5 SAFETY (NEEDLE) ×1 IMPLANT
NEEDLE HYPO 21X1.5 SAFETY (NEEDLE) ×2 IMPLANT
NS IRRIG 1000ML POUR BTL (IV SOLUTION) ×2 IMPLANT
PACK ABDOMINAL MAJOR (CUSTOM PROCEDURE TRAY) IMPLANT
PACK MINOR (CUSTOM PROCEDURE TRAY) ×1 IMPLANT
PAD ARMBOARD 7.5X6 YLW CONV (MISCELLANEOUS) ×2 IMPLANT
SET BASIN LINEN APH (SET/KITS/TRAYS/PACK) ×2 IMPLANT
SPONGE GAUZE 4X4 12PLY (GAUZE/BANDAGES/DRESSINGS) ×1 IMPLANT
SPONGE LAP 18X18 X RAY DECT (DISPOSABLE) ×1 IMPLANT
STAPLER VISISTAT (STAPLE) ×1 IMPLANT
SUT ETHIBOND 0 MO6 C/R (SUTURE) ×2 IMPLANT
SUT NOVA NAB GS-21 1 T12 (SUTURE) IMPLANT
SUT NOVA NAB GS-22 2 2-0 T-19 (SUTURE) IMPLANT
SUT NOVA NAB GS-26 0 60 (SUTURE) ×2 IMPLANT
SUT PROLENE 0 CT 1 CR/8 (SUTURE) IMPLANT
SUT SILK 2 0 (SUTURE)
SUT SILK 2-0 18XBRD TIE 12 (SUTURE) IMPLANT
SUT VIC AB 2-0 CT1 27 (SUTURE) ×2
SUT VIC AB 2-0 CT1 TAPERPNT 27 (SUTURE) ×1 IMPLANT
SUT VIC AB 3-0 SH 27 (SUTURE) ×2
SUT VIC AB 3-0 SH 27X BRD (SUTURE) ×1 IMPLANT
SUT VIC AB 4-0 PS2 27 (SUTURE) ×1 IMPLANT
SUT VICRYL AB 2 0 TIES (SUTURE) IMPLANT
SYR 20CC LL (SYRINGE) ×1 IMPLANT
SYR CONTROL 10ML LL (SYRINGE) ×1 IMPLANT
TAPE CLOTH SURG 4X10 WHT LF (GAUZE/BANDAGES/DRESSINGS) ×1 IMPLANT

## 2015-11-24 NOTE — Anesthesia Preprocedure Evaluation (Signed)
Anesthesia Evaluation  Patient identified by MRN, date of birth, ID band Patient awake    Reviewed: Allergy & Precautions, NPO status , Patient's Chart, lab work & pertinent test results  Airway Mallampati: III  TM Distance: >3 FB     Dental  (+) Teeth Intact, Dental Advisory Given   Pulmonary neg pulmonary ROS,    breath sounds clear to auscultation       Cardiovascular negative cardio ROS   Rhythm:Regular Rate:Normal     Neuro/Psych    GI/Hepatic negative GI ROS,   Endo/Other  Morbid obesity  Renal/GU      Musculoskeletal   Abdominal   Peds  Hematology   Anesthesia Other Findings   Reproductive/Obstetrics                             Anesthesia Physical Anesthesia Plan  ASA: II  Anesthesia Plan: General   Post-op Pain Management:    Induction: Intravenous, Rapid sequence and Cricoid pressure planned  Airway Management Planned: Oral ETT and Video Laryngoscope Planned  Additional Equipment:   Intra-op Plan:   Post-operative Plan: Extubation in OR  Informed Consent: I have reviewed the patients History and Physical, chart, labs and discussed the procedure including the risks, benefits and alternatives for the proposed anesthesia with the patient or authorized representative who has indicated his/her understanding and acceptance.     Plan Discussed with:   Anesthesia Plan Comments:         Anesthesia Quick Evaluation

## 2015-11-24 NOTE — Interval H&P Note (Signed)
History and Physical Interval Note:  11/24/2015 7:17 AM  [name]  has presented today for surgery, with the diagnosis of incisional hernia  The various methods of treatment have been discussed with the patient and family. After consideration of risks, benefits and other options for treatment, the patient has consented to  Procedure(s): HERNIA REPAIR INCISIONAL WITH MESH (N/A) as a surgical intervention .  The patient's history has been reviewed, patient examined, no change in status, stable for surgery.  I have reviewed the patient's chart and labs.  Questions were answered to the patient's satisfaction.     [name]

## 2015-11-24 NOTE — Discharge Instructions (Signed)
Open Hernia Repair, Care After °Refer to this sheet in the next few weeks. These instructions provide you with information on caring for yourself after your procedure. Your health care provider may also give you more specific instructions. Your treatment has been planned according to current medical practices, but problems sometimes occur. Call your health care provider if you have any problems or questions after your procedure. °WHAT TO EXPECT AFTER THE PROCEDURE °After your procedure, it is typical to have the following: °· Pain in your abdomen, especially along your incision. You will be given pain medicines to control the pain. °· Constipation. You may be given a stool softener to help prevent this. °HOME CARE INSTRUCTIONS °· Only take over-the-counter or prescription medicines as directed by your health care provider. °· Keep the incision area dry and clean. You may wash the incision area gently with soap and water 48 hours after surgery. Gently blot or dab the incision area dry. Do not take baths, use swimming pools, or use hot tubs for 10 days or until your health care provider approves. °· Change bandages (dressings) as directed by your health care provider. °· Continue your normal diet as directed by your health care provider. Eat plenty of fruits and vegetables to help prevent constipation. °· Drink enough fluids to keep your urine clear or pale yellow. This also helps prevent constipation. °· Do not drive until your health care provider says it is okay. °· Do not lift anything heavier than 10 lb (4.5 kg) or play contact sports for 4 weeks or until your health care provider approves. °· Follow up with your health care provider as directed. Ask your health care provider when to make an appointment to have your stitches (sutures) or staples removed. °SEEK MEDICAL CARE IF: °· You have increased bleeding coming from the incision site. °· You have blood in your stool. °· You have increasing pain in the incision  area. °· You see redness or swelling in the incision area. °· You have fluid (pus) coming from the incision. °· You have a fever. °· You notice a bad smell coming from the incision area or dressing. °SEEK IMMEDIATE MEDICAL CARE IF: °· You develop a rash. °· You have chest pain or shortness of breath. °· You feel lightheaded or feel faint. °  °This information is not intended to replace advice given to you by your health care provider. Make sure you discuss any questions you have with your health care provider. °  °Document Released: 02/12/2005 Document Revised: 08/16/2014 Document Reviewed: 03/07/2013 °Elsevier Interactive Patient Education ©2016 Else[employer] Inc. ° °

## 2015-11-24 NOTE — Anesthesia Procedure Notes (Signed)
Procedure Name: Intubation Date/Time: 11/24/2015 7:44 AM Performed by: Despina Hidden Pre-anesthesia Checklist: Patient identified, Emergency Drugs available, Suction available and Patient being monitored Patient Re-evaluated:Patient Re-evaluated prior to inductionOxygen Delivery Method: Circle system utilized Preoxygenation: Pre-oxygenation with 100% oxygen Intubation Type: IV induction, Rapid sequence and Cricoid Pressure applied Ventilation: Mask ventilation without difficulty Grade View: Grade III Tube size: 7.0 mm Number of attempts: 1 Airway Equipment and Method: Video-laryngoscopy and Oral airway Placement Confirmation: ETT inserted through vocal cords under direct vision,  positive ETCO2 and breath sounds checked- equal and bilateral Secured at: 22 cm Tube secured with: Tape Dental Injury: Teeth and Oropharynx as per pre-operative assessment  Future Recommendations: Recommend- induction with short-acting agent, and alternative techniques readily available

## 2015-11-24 NOTE — Op Note (Signed)
Patient:  [name]  DOB:  Jan 17, 1971  MRN:  [id number]   Preop Diagnosis:  Incisional hernia, recurrent  Postop Diagnosis:  Same, recurrent  Procedure:  Incisional herniorrhaphy with mesh, recurrent  Surgeon:  Franky Macho, M.D.  Anes:  Gen. endotracheal  Indications:  Patient is a 45 year old black female status post hernia repair in the past who presents with recurrent incisional hernia. The risks and benefits of the procedure including bleeding, infection, mesh use, and the possibility of recurrence of the hernia were fully explained to the patient, who gave informed consent.  Procedure note:  The patient was placed the supine position. After induction of general endotracheal anesthesia, the abdomen was prepped and draped using the usual sterile technique with DuraPrep. Surgical site confirmation was performed.  An incision was made just to the right of the umbilicus including a previous surgical scar. The dissection was taken down to the fascia. The patient had 2 hernia defects with incarcerated omentum within the hernia sacs. The hernia sacs were opened and the walls excised at the fascial level. Omentum that was adherent to the hernia sac was excised using the LigaSure in order to reduce the omentum into the peritoneal cavity. The 2 defects which measure approximately 2 cm in their greatest diameter were merged into one longitudinally. The resultant defect was 4 cm in its greatest diameter. An 8 cm Bard ventralax ST patch was then inserted and secured to the fascia using 0 Ethibond interrupted sutures. The overlying fascia was closed transversely using 0 Ethibond interrupted sutures. The subcutaneous layer was reapproximated using 2-0 Vicryl interrupted sutures. 0.5% Sensorcaine was instilled into the surrounding wound. The skin was closed using staples. Betadine ointment and dry sterile dressings were applied.  All tape and needle counts were correct at the end of the procedure.  The patient was extubated in the operating room and transferred to PACU in stable condition.  Complications:  None  EBL:  25 mL  Specimen:  None

## 2015-11-24 NOTE — Transfer of Care (Signed)
Immediate Anesthesia Transfer of Care Note  Patient: [name]  Procedure(s) Performed: Procedure(s): INCISIONAL HERNIORRHAPHY WITH MESH (N/A) INSERTION OF MESH (N/A)  Patient Location: PACU  Anesthesia Type:General  Level of Consciousness: awake and patient cooperative  Airway & Oxygen Therapy: Patient Spontanous Breathing and Patient connected to face mask oxygen  Post-op Assessment: Report given to RN, Post -op Vital signs reviewed and stable and Patient moving all extremities  Post vital signs: Reviewed and stable  Last Vitals:  Filed Vitals:   11/24/15 0725 11/24/15 0730  BP: 125/75 129/73  Pulse:    Temp:    Resp: 20 18    Complications: No apparent anesthesia complications

## 2015-11-24 NOTE — Anesthesia Postprocedure Evaluation (Signed)
Anesthesia Post Note  Patient: [name]  Procedure(s) Performed: Procedure(s) (LRB): INCISIONAL HERNIORRHAPHY WITH MESH (N/A) INSERTION OF MESH (N/A)  Patient location during evaluation: PACU Anesthesia Type: General Level of consciousness: awake and alert and patient cooperative Pain management: pain level controlled Vital Signs Assessment: post-procedure vital signs reviewed and stable Respiratory status: spontaneous breathing, nonlabored ventilation and respiratory function stable Cardiovascular status: blood pressure returned to baseline Postop Assessment: no signs of nausea or vomiting Anesthetic complications: no    Last Vitals:  Filed Vitals:   11/24/15 0915 11/24/15 0930  BP: 138/88 135/88  Pulse: 105 79  Temp: 36.8 C   Resp: 18 16    Last Pain: There were no vitals filed for this visit.               Markeshia Giebel J

## 2015-11-25 ENCOUNTER — Encounter (HOSPITAL_COMMUNITY): Payer: Self-pay | Admitting: General Surgery

## 2016-08-18 ENCOUNTER — Ambulatory Visit (INDEPENDENT_AMBULATORY_CARE_PROVIDER_SITE_OTHER): Payer: 59 | Admitting: Obstetrics and Gynecology

## 2016-08-18 ENCOUNTER — Encounter: Payer: Self-pay | Admitting: Obstetrics and Gynecology

## 2016-08-18 VITALS — BP 142/80 | HR 80 | Wt 232.0 lb

## 2016-08-18 DIAGNOSIS — Z6841 Body Mass Index (BMI) 40.0 and over, adult: Secondary | ICD-10-CM

## 2016-08-18 DIAGNOSIS — Z713 Dietary counseling and surveillance: Secondary | ICD-10-CM | POA: Diagnosis not present

## 2016-08-18 DIAGNOSIS — E6609 Other obesity due to excess calories: Secondary | ICD-10-CM

## 2016-08-18 NOTE — Progress Notes (Signed)
[facility] Visit  08/18/16            Patient name: [name] MRN [id number]  Date of birth: 1970-11-11  CC & HPI:  [name] is a 46 y.o. female presenting today to discuss weight loss options. Patient is requesting phentermine.  Hx prior use  ROS:  ROS  +desire to lose weight  Pertinent History Reviewed:   Reviewed: Significant for abdominal hysterectomy  Medical         Past Medical History:  Diagnosis Date  . Anemia   . Hernia, inguinal   . Medical history non-contributory   . Ventral hernia 10/02/2015                              Surgical Hx:    Past Surgical History:  Procedure Laterality Date  . ABDOMINAL HYSTERECTOMY    . CESAREAN SECTION     x 2  . ESOPHAGOGASTRODUODENOSCOPY N/A 11/28/2013   Procedure: ESOPHAGOGASTRODUODENOSCOPY (EGD);  Surgeon: Corbin Ade, MD;  Location[facility];  Service: Endoscopy;  Laterality: N/A;  12:15  . EYE SURGERY    . INCISIONAL HERNIA REPAIR N/A 11/24/2015   Procedure: Sherald Hess HERNIORRHAPHY WITH MESH;  Surgeon: Franky Macho, MD;  Location: [facility];  Service: General;  Laterality: N/A;  . INSERTION OF MESH N/A 11/24/2015   Procedure: INSERTION OF MESH;  Surgeon: Franky Macho, MD;  Location: [facility];  Service: General;  Laterality: N/A;  . TONSILLECTOMY     Medications: Reviewed & Updated - see associated section                       Current Outpatient Prescriptions:  .  esomeprazole (NEXIUM) 40 MG capsule, Take 40 mg by mouth daily at 12 noon., Disp: , Rfl:  .  oxyCODONE-acetaminophen (PERCOCET) 7.5-325 MG tablet, Take 1-2 tablets by mouth every 4 (four) hours as needed. (Patient not taking: Reported on 08/18/2016), Disp: 50 tablet, Rfl: 0 .  Probiotic Product (PROBIOTIC PO), Take by mouth daily., Disp: , Rfl:    Social History: Reviewed -  reports that she has never smoked. She has never used smokeless tobacco.  Objective Findings:  Vitals: Blood pressure (!) 142/80, pulse 80, weight 232 lb (105.2  kg).  Physical Examination: General appearance - alert, well appearing, and in no distress Mental status - alert, oriented to person, place, and time Pelvic - examination not indicated  Discussion: Discussed with pt weight loss methods including food measurement and calorie counting using apps such as MyNetDiary or My Fitness Pal. Recommended the use of measuring cups and spoons to monitor serving sizes. Encouraged adequate daily water intake, especially prior to meals to eliminate overeating. Additionally encouraged pt to become more active by taking daily walks of at least 30 minute duration, join a local gym such as the [facility] or attend water aerobics classes. Also advised pt to use pedometer on smartphone or utilize a smartband such as FitBit to keep track of daily activity.   At end of discussion, pt had opportunity to ask questions and has no further questions at this time.   Specific discussion of lifestyle changes, behavioral modifications and healthy eating habits as noted above. Greater than 50% was spent in counseling and coordination of care with the patient.  Total time greater than: 25 minutes.    Assessment & Plan:   A:  1. Obesity, Body  mass index is 52.95 kg/m.   P:  1. Phentermine x 2 m 2. Exercise and calorie count 3. Follow up in 3 months     By signing my name below, I, Sonum Patel, attest that this documentation has been prepared under the direction and in the presence of Tilda Burrow, MD. Electronically Signed: Sonum Patel, Neurosurgeon. 08/18/16. 4:25 PM.  I personally performed the services described in this documentation, which was SCRIBED in my presence. The recorded information has been reviewed and considered accurate. It has been edited as necessary during review. Tilda Burrow, MD

## 2016-10-18 ENCOUNTER — Ambulatory Visit (INDEPENDENT_AMBULATORY_CARE_PROVIDER_SITE_OTHER): Payer: 59 | Admitting: Obstetrics and Gynecology

## 2016-10-18 ENCOUNTER — Encounter: Payer: Self-pay | Admitting: Obstetrics and Gynecology

## 2016-10-18 VITALS — BP 148/92 | HR 100 | Wt 216.6 lb

## 2016-10-18 DIAGNOSIS — R030 Elevated blood-pressure reading, without diagnosis of hypertension: Secondary | ICD-10-CM | POA: Diagnosis not present

## 2016-10-18 DIAGNOSIS — Z6841 Body Mass Index (BMI) 40.0 and over, adult: Secondary | ICD-10-CM | POA: Diagnosis not present

## 2016-10-18 MED ORDER — PHENTERMINE HCL 37.5 MG PO CAPS: 37.5000 mg | ORAL_CAPSULE | ORAL | 0 refills | Status: DC

## 2016-10-18 NOTE — Progress Notes (Addendum)
Patient ID: [name], female   DOB: 24-Jan-1971, 46 y.o.   MRN: [id number]   [facility] Visit  @DATE @            Patient name: [name] MRN [id number]  Date of birth: 12-22-1970  CC & HPI:   Chief Complaint  Patient presents with   Follow-up     [name] is a 46 y.o. female presenting today for f/u of weight loss discussion on 08/18/16. Pt was prescribed Phentermine for 2 months at this visit and behavioral modifications/lifestyle changes were discussed. Pt has lost 16 lbs since this visit (from 232 lbs 08/18/16 to 216 lbs today). Per pt, she has been successful by participating in a 40 day fast with her church (cut out meat, pork, caffeine, soda, etc). As a part of their Lenten 40 days process   ROS:  ROS +weight loss follow up   Pertinent History Reviewed:   Reviewed: Significant for abdominal hysterectomy, C-section x2 Medical         Past Medical History:  Diagnosis Date   Anemia    Hernia, inguinal    Medical history non-contributory    Ventral hernia 10/02/2015                              Surgical Hx:    Past Surgical History:  Procedure Laterality Date   ABDOMINAL HYSTERECTOMY     CESAREAN SECTION     x 2   ESOPHAGOGASTRODUODENOSCOPY N/A 11/28/2013   Procedure: ESOPHAGOGASTRODUODENOSCOPY (EGD);  Surgeon: Corbin Ade, MD;  [facility];  Service: Endoscopy;  Laterality: N/A;  12:15   EYE SURGERY     INCISIONAL HERNIA REPAIR N/A 11/24/2015   Procedure: Sherald Hess HERNIORRHAPHY WITH MESH;  Surgeon: Franky Macho, MD;  Location: [facility];  Service: General;  Laterality: N/A;   INSERTION OF MESH N/A 11/24/2015   Procedure: INSERTION OF MESH;  Surgeon: Franky Macho, MD;  Location: [facility];  Service: General;  Laterality: N/A;   TONSILLECTOMY     Medications: Reviewed & Updated - see associated section                       Current Outpatient Prescriptions:    esomeprazole (NEXIUM) 40 MG capsule, Take 40 mg by mouth  daily at 12 noon., Disp: , Rfl:    oxyCODONE-acetaminophen (PERCOCET) 7.5-325 MG tablet, Take 1-2 tablets by mouth every 4 (four) hours as needed. (Patient not taking: Reported on 08/18/2016), Disp: 50 tablet, Rfl: 0   Probiotic Product (PROBIOTIC PO), Take by mouth daily., Disp: , Rfl:    Social History: Reviewed -  reports that she has never smoked. She has never used smokeless tobacco.  Objective Findings:  Vitals: Blood pressure (!) 148/92, pulse 100, weight 216 lb 9.6 oz (98.2 kg). Body mass index is 49.44 kg/m.  Physical Examination: Discussion only   Assessment & Plan:   A:  1. Obesity,BMI 49 16 lb weight loss since 08/18/16   P:  1. Continue Phentermine for 30 more days  2. Check BP at home (pt states elevation today related to caring for her nieces/nephews this AM)  By signing my name below, I, Doreatha Martin, attest that this documentation has been prepared under the direction and in the presence of Tilda Burrow, MD. Electronically Signed: Doreatha Martin, ED Scribe. 10/18/16. 9:17 AM.  I personally performed the services described  in this documentation, which was SCRIBED in my presence. The recorded information has been reviewed and considered accurate. It has been edited as necessary during review. Tilda Burrow, MD

## 2016-10-18 NOTE — Patient Instructions (Signed)

## 2016-10-18 NOTE — Progress Notes (Signed)
Patient ID: [name], female   DOB: 08/01/1971, 45 y.o.   MRN: [id number] ° ° [facility] Visit  °@DATE@            Patient name: [name] MRN [id number]  Date of birth: 01/14/1971 ° °CC & HPI:  ° °Chief Complaint  °Patient presents with  °• Follow-up  °  ° °[name] is a 45 y.o. female presenting today for f/u of weight loss discussion on 08/18/16. Pt was prescribed Phentermine for 2 months at this visit and behavioral modifications/lifestyle changes were discussed. Pt has lost 16 lbs since this visit (from 232 lbs 08/18/16 to 216 lbs today). Per pt, she has been successful by participating in a 40 day fast with her church (cut out meat, pork, caffeine, soda, etc). As a part of their Lenten 40 days process  ° °ROS:  °ROS °+weight loss follow up  ° °Pertinent History Reviewed:  ° Reviewed: Significant for abdominal hysterectomy, C-section x2 °Medical         °Past Medical History:  °Diagnosis Date  °• Anemia   °• Hernia, inguinal   °• Medical history non-contributory   °• Ventral hernia 10/02/2015  °                            °Surgical Hx:    °Past Surgical History:  °Procedure Laterality Date  °• ABDOMINAL HYSTERECTOMY    °• CESAREAN SECTION    ° x 2  °• ESOPHAGOGASTRODUODENOSCOPY N/A 11/28/2013  ° Procedure: ESOPHAGOGASTRODUODENOSCOPY (EGD);  Surgeon: Robert M Rourk, MD;  [facility];  Service: Endoscopy;  Laterality: N/A;  12:15  °• EYE SURGERY    °• INCISIONAL HERNIA REPAIR N/A 11/24/2015  ° Procedure: INCISIONAL HERNIORRHAPHY WITH MESH;  Surgeon: Mark Jenkins, MD;  Location: [facility];  Service: General;  Laterality: N/A;  °• INSERTION OF MESH N/A 11/24/2015  ° Procedure: INSERTION OF MESH;  Surgeon: Mark Jenkins, MD;  Location: [facility];  Service: General;  Laterality: N/A;  °• TONSILLECTOMY    ° °Medications: Reviewed & Updated - see associated section °                      °Current Outpatient Prescriptions:  °•  esomeprazole (NEXIUM) 40 MG capsule, Take 40 mg by mouth  daily at 12 noon., Disp: , Rfl:  °•  oxyCODONE-acetaminophen (PERCOCET) 7.5-325 MG tablet, Take 1-2 tablets by mouth every 4 (four) hours as needed. (Patient not taking: Reported on 08/18/2016), Disp: 50 tablet, Rfl: 0 °•  Probiotic Product (PROBIOTIC PO), Take by mouth daily., Disp: , Rfl:  ° ° °Social History: Reviewed -  reports that she has never smoked. She has never used smokeless tobacco. ° °Objective Findings:  °Vitals: Blood pressure (!) 148/92, pulse 100, weight 216 lb 9.6 oz (98.2 kg). °Body mass index is 49.44 kg/m². ° °Physical Examination: Discussion only  ° °Assessment & Plan:  ° °A:  °1. Obesity,BMI 49 16 lb weight loss since 08/18/16  ° °P:  °1. Continue Phentermine for 30 more days  °2. Check BP at home (pt states elevation today related to caring for her nieces/nephews this AM) ° °By signing my name below, I, Eva Mathews, attest that this documentation has been prepared under the direction and in the presence of Myriah Boggus V, MD. °Electronically Signed: Eva Mathews, ED Scribe. 10/18/16. 9:17 AM. ° °I personally performed the services described   in this documentation, which was SCRIBED in my presence. The recorded information has been reviewed and considered accurate. It has been edited as necessary during review. °Lorn Butcher V, MD ° °   °

## 2016-11-01 ENCOUNTER — Telehealth: Payer: Self-pay | Admitting: Obstetrics and Gynecology

## 2016-11-12 NOTE — Telephone Encounter (Signed)
Patient spoken to at [facility], she has adjusted to the new pill type.

## 2016-12-14 ENCOUNTER — Telehealth: Payer: Self-pay | Admitting: *Deleted

## 2016-12-15 ENCOUNTER — Other Ambulatory Visit: Payer: Self-pay | Admitting: Obstetrics & Gynecology

## 2016-12-15 MED ORDER — FLUCONAZOLE 150 MG PO TABS: 150.0000 mg | ORAL_TABLET | Freq: Once | ORAL | 0 refills | Status: AC

## 2016-12-15 NOTE — Telephone Encounter (Signed)
LMOVM that prescription was sent to pharmacy.  

## 2016-12-16 DIAGNOSIS — R229 Localized swelling, mass and lump, unspecified: Secondary | ICD-10-CM | POA: Diagnosis not present

## 2016-12-16 DIAGNOSIS — R430 Anosmia: Secondary | ICD-10-CM | POA: Diagnosis not present

## 2016-12-16 DIAGNOSIS — J3489 Other specified disorders of nose and nasal sinuses: Secondary | ICD-10-CM | POA: Diagnosis not present

## 2016-12-21 DIAGNOSIS — R0981 Nasal congestion: Secondary | ICD-10-CM | POA: Diagnosis not present

## 2016-12-21 DIAGNOSIS — R430 Anosmia: Secondary | ICD-10-CM | POA: Diagnosis not present

## 2016-12-21 DIAGNOSIS — J3489 Other specified disorders of nose and nasal sinuses: Secondary | ICD-10-CM | POA: Diagnosis not present

## 2017-05-23 ENCOUNTER — Encounter (HOSPITAL_COMMUNITY): Payer: Self-pay | Admitting: Emergency Medicine

## 2017-05-23 ENCOUNTER — Inpatient Hospital Stay (HOSPITAL_COMMUNITY)
Admission: EM | Admit: 2017-05-23 | Discharge: 2017-05-27 | DRG: 603 | Disposition: A | Discharge status: Home / Self Care | Payer: 59 | Attending: Pulmonary Disease | Admitting: Pulmonary Disease

## 2017-05-23 ENCOUNTER — Emergency Department (HOSPITAL_COMMUNITY): Payer: 59

## 2017-05-23 DIAGNOSIS — J340 Abscess, furuncle and carbuncle of nose: Secondary | ICD-10-CM | POA: Diagnosis present

## 2017-05-23 DIAGNOSIS — R030 Elevated blood-pressure reading, without diagnosis of hypertension: Secondary | ICD-10-CM | POA: Diagnosis present

## 2017-05-23 DIAGNOSIS — Z79899 Other long term (current) drug therapy: Secondary | ICD-10-CM

## 2017-05-23 DIAGNOSIS — L0390 Cellulitis, unspecified: Secondary | ICD-10-CM | POA: Diagnosis not present

## 2017-05-23 DIAGNOSIS — R197 Diarrhea, unspecified: Secondary | ICD-10-CM | POA: Diagnosis not present

## 2017-05-23 DIAGNOSIS — Z888 Allergy status to other drugs, medicaments and biological substances status: Secondary | ICD-10-CM | POA: Diagnosis not present

## 2017-05-23 DIAGNOSIS — E876 Hypokalemia: Secondary | ICD-10-CM | POA: Diagnosis present

## 2017-05-23 DIAGNOSIS — L03211 Cellulitis of face: Secondary | ICD-10-CM | POA: Diagnosis not present

## 2017-05-23 DIAGNOSIS — H0589 Other disorders of orbit: Secondary | ICD-10-CM | POA: Diagnosis not present

## 2017-05-23 DIAGNOSIS — Z6841 Body Mass Index (BMI) 40.0 and over, adult: Secondary | ICD-10-CM

## 2017-05-23 DIAGNOSIS — Z90710 Acquired absence of both cervix and uterus: Secondary | ICD-10-CM | POA: Diagnosis not present

## 2017-05-23 LAB — CBC WITH DIFFERENTIAL/PLATELET
Basophils Absolute: 0 10*3/uL (ref 0.0–0.1)
Basophils Relative: 0 %
EOS ABS: 0.2 10*3/uL (ref 0.0–0.7)
Eosinophils Relative: 2 %
HCT: 37.8 % (ref 36.0–46.0)
HEMOGLOBIN: 12.3 g/dL (ref 12.0–15.0)
LYMPHS ABS: 2.9 10*3/uL (ref 0.7–4.0)
Lymphocytes Relative: 26 %
MCH: 29.3 pg (ref 26.0–34.0)
MCHC: 32.5 g/dL (ref 30.0–36.0)
MCV: 90 fL (ref 78.0–100.0)
MONOS PCT: 5 %
Monocytes Absolute: 0.5 10*3/uL (ref 0.1–1.0)
NEUTROS ABS: 7.7 10*3/uL (ref 1.7–7.7)
NEUTROS PCT: 67 %
Platelets: 324 10*3/uL (ref 150–400)
RBC: 4.2 MIL/uL (ref 3.87–5.11)
RDW: 15.3 % (ref 11.5–15.5)
WBC: 11.2 10*3/uL — ABNORMAL HIGH (ref 4.0–10.5)

## 2017-05-23 LAB — BASIC METABOLIC PANEL
Anion gap: 8 (ref 5–15)
BUN: 8 mg/dL (ref 6–20)
CHLORIDE: 104 mmol/L (ref 101–111)
CO2: 26 mmol/L (ref 22–32)
CREATININE: 0.67 mg/dL (ref 0.44–1.00)
Calcium: 8.8 mg/dL — ABNORMAL LOW (ref 8.9–10.3)
GFR calc Af Amer: >60 mL/min (ref >60)
GFR calc non Af Amer: >60 mL/min (ref >60)
GLUCOSE: 107 mg/dL — AB (ref 65–99)
Potassium: 3.2 mmol/L — ABNORMAL LOW (ref 3.5–5.1)
Sodium: 138 mmol/L (ref 135–145)

## 2017-05-23 MED ORDER — CLINDAMYCIN PHOSPHATE 600 MG/50ML IV SOLN
600.0000 mg | Freq: Once | INTRAVENOUS | Status: AC
  Administered 2017-05-23: 600 mg via INTRAVENOUS
  Filled 2017-05-23: qty 50

## 2017-05-23 MED ORDER — PENTAFLUOROPROP-TETRAFLUOROETH EX AERO
INHALATION_SPRAY | CUTANEOUS | Status: DC | PRN
  Filled 2017-05-23: qty 103.5

## 2017-05-23 MED ORDER — ACETAMINOPHEN 325 MG PO TABS
650.0000 mg | ORAL_TABLET | Freq: Four times a day (QID) | ORAL | Status: DC | PRN
  Administered 2017-05-23 – 2017-05-25 (×6): 650 mg via ORAL
  Filled 2017-05-23 (×6): qty 2

## 2017-05-23 MED ORDER — ACETAMINOPHEN 650 MG RE SUPP
650.0000 mg | Freq: Four times a day (QID) | RECTAL | Status: DC | PRN
Start: 2017-05-23 — End: 2017-05-27

## 2017-05-23 MED ORDER — IOPAMIDOL (ISOVUE-300) INJECTION 61%
75.0000 mL | Freq: Once | INTRAVENOUS | Status: AC | PRN
  Administered 2017-05-23: 75 mL via INTRAVENOUS

## 2017-05-23 MED ORDER — ACETAMINOPHEN 325 MG PO TABS
325.0000 mg | ORAL_TABLET | Freq: Once | ORAL | Status: AC
  Administered 2017-05-23: 325 mg via ORAL
  Filled 2017-05-23: qty 1

## 2017-05-23 MED ORDER — METHYLPREDNISOLONE SODIUM SUCC 125 MG IJ SOLR
80.0000 mg | Freq: Once | INTRAMUSCULAR | Status: AC
Start: 2017-05-23 — End: 2017-05-23
  Administered 2017-05-23: 80 mg via INTRAVENOUS
  Filled 2017-05-23: qty 2

## 2017-05-23 NOTE — ED Provider Notes (Signed)
Pt signed over to my care pending CT imaging.  Suggestion of small possible abscess formation, facial (pre but not post orbital cellulitis).  Discussed with Dr. Ranae Palms who also saw patient.  Pt will need needle aspiration, IV clindamycin and admission for further IV abx.    INCISION AND DRAINAGE Performed by: Burgess Amor Consent: Verbal consent obtained. Risks and benefits: risks, benefits and alternatives were discussed Type: abscess  Body area: right proximal nasal bridge  Anesthesia: topical Gebauer freeze spray (eye covered to avoid overspray)  Needle aspiration using #27 gauge  Local anesthetic: per above Anesthetic total: per above  Complexity: simple Drainage: small amount of blood followed by clear watery dc, no purulence obtained  Drainage amount: small  Packing material: none  Patient tolerance: Patient tolerated the procedure well with no immediate complications.  Call placed to Hospitalist group for admission.      Burgess Amor, PA-C 05/23/17 Stephenie Acres, MD 05/24/17 [phone]

## 2017-05-23 NOTE — ED Triage Notes (Signed)
Pt c/o right eye swelling since yesterday. deneis drainage. C/o pain and Little blurred vision. swleling noted.

## 2017-05-23 NOTE — ED Provider Notes (Signed)
[facility] EMERGENCY DEPARTMENT Provider Note   CSN: [id number] Arrival date & time: 05/23/17  1243     History   Chief Complaint Chief Complaint  Patient presents with  . Eye Pain    HPI [name] is a 46 y.o. female.  HPI  [name] is a 46 y.o. female who presents to the Emergency Department complaining of right facial pain and swelling for one day.  She states that she has had a nodule to the right side of her nose for some time that has been evaluated by her PCP and by ENT.  States she noticed some mild swelling to the area yesterday that has progressed today.  Swelling extending around the right eye and to the left upper eyelid as well.  She complains of pain, but denies itching or drainage.  She states her vision of the right eye is slightly blurred.  She denies injury, drainage, fever, headache, and pain to the lower face or neck. She has applied warm compresses without relief.    Past Medical History:  Diagnosis Date  . Anemia   . Hernia, inguinal   . Medical history non-contributory   . Ventral hernia 10/02/2015    Patient Active Problem List   Diagnosis Date Noted  . Obesity, Class III, BMI 40-49.9 (morbid obesity) (HCC) 08/19/2016  . Ventral hernia 10/02/2015  . Dyspepsia 11/20/2013    Past Surgical History:  Procedure Laterality Date  . ABDOMINAL HYSTERECTOMY    . CESAREAN SECTION     x 2  . ESOPHAGOGASTRODUODENOSCOPY N/A 11/28/2013   Procedure: ESOPHAGOGASTRODUODENOSCOPY (EGD);  Surgeon: Corbin Ade, MD;  [facility];  Service: Endoscopy;  Laterality: N/A;  12:15  . EYE SURGERY    . INCISIONAL HERNIA REPAIR N/A 11/24/2015   Procedure: Sherald Hess HERNIORRHAPHY WITH MESH;  Surgeon: Franky Macho, MD;  Location: [facility];  Service: General;  Laterality: N/A;  . INSERTION OF MESH N/A 11/24/2015   Procedure: INSERTION OF MESH;  Surgeon: Franky Macho, MD;  Location: [facility];  Service: General;  Laterality: N/A;  . TONSILLECTOMY       OB History    Gravida Para Term Preterm AB Living   4 2   1 1 2    SAB TAB Ectopic Multiple Live Births       1           Home Medications    Prior to Admission medications   Medication Sig Start Date End Date Taking? Authorizing Provider  phentermine 37.5 MG capsule Take 1 capsule (37.5 mg total) by mouth every morning. 10/18/16  Yes Tilda Burrow, MD    Family History Family History  Problem Relation Age of Onset  . Cancer Mother        lung  . Other Father        was shot and killed  . Hyperlipidemia Sister   . Dementia Maternal Grandmother   . Heart attack Maternal Grandfather   . Colon cancer Neg Hx     Social History Social History  Substance Use Topics  . Smoking status: Never Smoker  . Smokeless tobacco: Never Used  . Alcohol use Yes     Comment: rarely     Allergies   Other   Review of Systems Review of Systems  Constitutional: Negative for chills and fever.  HENT: Negative for sore throat and trouble swallowing.        Pain and swelling of the right face  Eyes:  Negative for discharge.       Blurred vision right eye  Respiratory: Negative for chest tightness and shortness of breath.   Cardiovascular: Negative for chest pain.  Gastrointestinal: Negative for nausea and vomiting.  Musculoskeletal: Negative for neck pain.  Skin: Negative for rash.  Neurological: Negative for dizziness, weakness and headaches.     Physical Exam Updated Vital Signs BP (!) 156/84 (BP Location: Right Arm)   Pulse 81   Temp 99.4 F (37.4 C) (Oral)   Resp 18   Ht  (1.397 m)   Wt 99.8 kg (220 lb)   SpO2 99%   BMI 51.13 kg/m   Physical Exam  Constitutional: She is oriented to person, place, and time. She appears well-developed and well-nourished. No distress.  HENT:  Head: Normocephalic and atraumatic. Head is with right periorbital erythema.  Focal area of induration and mild erythema to the right lateral nose near the medial canthus. Periorbital  edema bilaterally with right > left.    Eyes: Pupils are equal, round, and reactive to light. Conjunctivae and EOM are normal.  Neck: Normal range of motion. Neck supple.  Cardiovascular: Normal rate, regular rhythm and normal heart sounds.   No murmur heard. Pulmonary/Chest: Effort normal and breath sounds normal. No respiratory distress. She has no wheezes.  Musculoskeletal: Normal range of motion.  Neurological: She is alert and oriented to person, place, and time. No sensory deficit. She exhibits normal muscle tone. Coordination normal.  Skin: Skin is warm and dry. No rash noted.  Psychiatric: She has a normal mood and affect.  Nursing note and vitals reviewed.    ED Treatments / Results  Labs (all labs ordered are listed, but only abnormal results are displayed) Labs Reviewed  CBC WITH DIFFERENTIAL/PLATELET - Abnormal; Notable for the following:       Result Value   WBC 11.2 (*)    All other components within normal limits  BASIC METABOLIC PANEL - Abnormal; Notable for the following:    Potassium 3.2 (*)    Glucose, Bld 107 (*)    Calcium 8.8 (*)    All other components within normal limits    EKG  EKG Interpretation None       Radiology No results found.  Procedures Procedures (including critical care time)  Medications Ordered in ED Medications  acetaminophen (TYLENOL) tablet 325 mg (325 mg Oral Given 05/23/17 1711)     Initial Impression / Assessment and Plan / ED Course  I have reviewed the triage vital signs and the nursing notes.  Pertinent labs & imaging results that were available during my care of the patient were reviewed by me and considered in my medical decision making (see chart for details).       Visual Acuity  Right Eye Distance: 20/50 Left Eye Distance: 20/25 Bilateral Distance: with glasses 20/25    Pt with hx of skin nodule to the lateral nose.  Recently symptomatic.  Facial edema and erythema felt to be related to possible  abscess.  Will obtain labs and CT maxillofacial to further assess.   1700  Discussed pt with Dr. Ranae Palms.  End of shift, pt signed out to [employer], PA-C.   Final Clinical Impressions(s) / ED Diagnoses   Final diagnoses:  None    New Prescriptions New Prescriptions   No medications on file     Pauline Aus, Cordelia Poche 05/23/17 1733    Loren Racer, MD 05/24/17 [phone]

## 2017-05-23 NOTE — H&P (Addendum)
TRH H&P   Patient Demographics:    [name], is a 46 y.o. female  MRN: [id number]   DOB - 1971/07/25  Admit Date - 05/23/2017  Outpatient Primary MD for the patient is Sinda Du, MD  Referring MD/NP/PA: Evalee Jefferson  Outpatient Specialists:   Patient coming from: home  Chief Complaint  Patient presents with  . Eye Pain      HPI:    [name]  is a 46 y.o. female, w papule right side of nose for the past 2 year, began to have some redness and swelling over the past 2 days.  Pt presented to ED due to swelling, of her eyes.   In ED Wbc 11.2, Hgb 12.3, Plt 324 K 3.2,  Bun 8, creatinine 0.67  CT scan =< IMPRESSION: Preseptal periorbital soft tissue swelling affecting the RIGHT eye. No postseptal inflammation.  Concern for small RIGHT-sided medial canthus superficial abscess, 7 mm in diameter.  No underlying sinus disease or osseous injury.   Pt will be admitted for superficial abscess 44m as well as cellulitis.    Review of systems:    In addition to the HPI above, No Fever-chills, No Headache, No changes with Vision or hearing, No problems swallowing food or Liquids, No Chest pain, Cough or Shortness of Breath, No Abdominal pain, No Nausea or Vommitting, Bowel movements are regular, No Blood in stool or Urine, No dysuria, No new skin rashes or bruises, No new joints pains-aches,  No new weakness, tingling, numbness in any extremity, No recent weight gain or loss, No polyuria, polydypsia or polyphagia, No significant Mental Stressors.  A full 10 point Review of Systems was done, except as stated above, all other Review of Systems were negative.   With Past History of the following :    Past Medical History:  Diagnosis Date  . Anemia   . Hernia, inguinal   . Medical history non-contributory   . Ventral hernia 10/02/2015       Past Surgical History:  Procedure Laterality Date  . ABDOMINAL HYSTERECTOMY    . CESAREAN SECTION     x 2  . ESOPHAGOGASTRODUODENOSCOPY N/A 11/28/2013   Procedure: ESOPHAGOGASTRODUODENOSCOPY (EGD);  Surgeon: RDaneil Dolin MD;  Location: [facility];  Service: Endoscopy;  Laterality: N/A;  12:15  . EYE SURGERY    . INCISIONAL HERNIA REPAIR N/A 11/24/2015   Procedure: IFatima BlankHERNIORRHAPHY WITH MESH;  Surgeon: MAviva Signs MD;  Location: [facility];  Service: General;  Laterality: N/A;  . INSERTION OF MESH N/A 11/24/2015   Procedure: INSERTION OF MESH;  Surgeon: MAviva Signs MD;  Location: [facility];  Service: General;  Laterality: N/A;  . TONSILLECTOMY        Social History:     Social History  Substance Use Topics  . Smoking status: Never Smoker  . Smokeless tobacco: Never Used  . Alcohol use  Yes     Comment: rarely     Lives - at home  Mobility - walks by self   Family History :     Family History  Problem Relation Age of Onset  . Cancer Mother        lung  . Other Father        was shot and killed  . Hyperlipidemia Sister   . Dementia Maternal Grandmother   . Heart attack Maternal Grandfather   . Colon cancer Neg Hx       Home Medications:   Prior to Admission medications   Medication Sig Start Date End Date Taking? Authorizing Provider  phentermine 37.5 MG capsule Take 1 capsule (37.5 mg total) by mouth every morning. 10/18/16  Yes Jonnie Kind, MD     Allergies:     Allergies  Allergen Reactions  . Other     peaches     Physical Exam:   Vitals  Blood pressure (!) 157/79, pulse 82, temperature 98.6 F (37 C), temperature source Oral, resp. rate 16, height 4' 7"  (1.397 m), weight 99.8 kg (220 lb), SpO2 100 %.   1. General  lying in bed in NAD,    2. Normal affect and insight, Not Suicidal or Homicidal, Awake Alert, Oriented X 3.  3. No F.N deficits, ALL C.Nerves Intact, Strength 5/5 all 4 extremities, Sensation intact all 4  extremities, Plantars down going.  4. Ears appear Normal, Conjunctivae clear, PERRLA. Moist Oral Mucosa. Slight swelling of bilateral upper eye lids, and redness around pustule on the right side of nose   5. Supple Neck, No JVD, No cervical lymphadenopathy appriciated, No Carotid Bruits.  6. Symmetrical Chest wall movement, Good air movement bilaterally, CTAB.  7. RRR, No Gallops, Rubs or Murmurs, No Parasternal Heave.  8. Positive Bowel Sounds, Abdomen Soft, No tenderness, No organomegaly appriciated,No rebound -guarding or rigidity.  9.  No Cyanosis, Normal Skin Turgor, No Skin Rash or Bruise.  10. Good muscle tone,  joints appear normal , no effusions, Normal ROM.  11. No Palpable Lymph Nodes in Neck or Axillae     Data Review:    CBC  Recent Labs Lab 05/23/17 1602  WBC 11.2*  HGB 12.3  HCT 37.8  PLT 324  MCV 90.0  MCH 29.3  MCHC 32.5  RDW 15.3  LYMPHSABS 2.9  MONOABS 0.5  EOSABS 0.2  BASOSABS 0.0   ------------------------------------------------------------------------------------------------------------------  Chemistries   Recent Labs Lab 05/23/17 1602  NA 138  K 3.2*  CL 104  CO2 26  GLUCOSE 107*  BUN 8  CREATININE 0.67  CALCIUM 8.8*   ------------------------------------------------------------------------------------------------------------------ estimated creatinine clearance is 86.3 mL/min (by C-G formula based on SCr of 0.67 mg/dL). ------------------------------------------------------------------------------------------------------------------ No results for input(s): TSH, T4TOTAL, T3FREE, THYROIDAB in the last 72 hours.  Invalid input(s): FREET3  Coagulation profile No results for input(s): INR, PROTIME in the last 168 hours. ------------------------------------------------------------------------------------------------------------------- No results for input(s): DDIMER in the last 72  hours. -------------------------------------------------------------------------------------------------------------------  Cardiac Enzymes No results for input(s): CKMB, TROPONINI, MYOGLOBIN in the last 168 hours.  Invalid input(s): CK ------------------------------------------------------------------------------------------------------------------ No results found for: BNP   ---------------------------------------------------------------------------------------------------------------  Urinalysis    Component Value Date/Time   COLORURINE YELLOW 10/12/2015 [place] 10/12/2015 0637   LABSPEC 1.010 10/12/2015 [place] 7.5 10/12/2015 [place] 10/12/2015 [place] 10/12/2015 [place] 10/12/2015 [place] 10/12/2015 4327  PROTEINUR NEGATIVE 10/12/2015 0637   UROBILINOGEN 0.2 01/13/2013 2335   NITRITE NEGATIVE 10/12/2015 0637   LEUKOCYTESUR NEGATIVE 10/12/2015 3094    ----------------------------------------------------------------------------------------------------------------   Imaging Results:    Ct Maxillofacial W Contrast  Result Date: 05/23/2017 CLINICAL DATA:  RIGHT eye swelling for 1 day. EXAM: CT MAXILLOFACIAL WITH CONTRAST TECHNIQUE: Multidetector CT imaging of the maxillofacial structures was performed with intravenous contrast. Multiplanar CT image reconstructions were also generated. CONTRAST:  1m ISOVUE-300 IOPAMIDOL (ISOVUE-300) INJECTION 61% COMPARISON:  None. FINDINGS: Osseous: No fracture or mandibular dislocation. No destructive process. Cervical spondylosis C5-C6. Orbits: There is preseptal periorbital soft tissue swelling on the RIGHT but not the LEFT. No postseptal inflammation is evident. Globes are symmetric. Adjacent to the medial canthus of the RIGHT eye, there is a 7 mm diameter, peripherally enhancing low attenuation area, which could represent a small abscess. See  image 29 series 2. Sinuses: No sinus pathology.  No blowout injury. Soft tissues: Other than the RIGHT periorbital cellulitis, negative. Limited intracranial: No acute findings.  Partial empty sella. IMPRESSION: Preseptal periorbital soft tissue swelling affecting the RIGHT eye. No postseptal inflammation. Concern for small RIGHT-sided medial canthus superficial abscess, 7 mm in diameter. No underlying sinus disease or osseous injury. Electronically Signed   By: JStaci RighterM.D.   On: 05/23/2017 18:20      Assessment & Plan:    Active Problems:   Cellulitis    Cellulitis Please f/u Culture from abscess  Check ESR vanco iv, zosyn iv pharmacy to dose  Eye lid swelling  ? Cellulitis vs allerghic reaction Solumedrol 862miv x1 Please reassess in am  Hypokalemia Replete Check cmp in am  Elevated bp Monitor,  Hydralazine 1020mv q6h prn sbp >160  DVT Prophylaxis Lovenox - SCDs   AM Labs Ordered, also please review Full Orders  Family Communication: Admission, patients condition and plan of care including tests being ordered have been discussed with the patient  who indicate understanding and agree with the plan and Code Status.  Code Status FULL CODE  Likely DC to  home  Condition GUARDED   Consults called: none  Admission status:inpatient   Time spent in minutes : 45    JamJani GravelD on 05/23/2017 at 8:15 PM  Between 7am to 7pm - Pager - [phone] 7pm go to [email or web address] - password [email or web address] - Office  [phone]

## 2017-05-24 LAB — COMPREHENSIVE METABOLIC PANEL
ALK PHOS: 49 U/L (ref 38–126)
ALT: 14 U/L (ref 14–54)
AST: 14 U/L — AB (ref 15–41)
Albumin: 3.6 g/dL (ref 3.5–5.0)
Anion gap: 10 (ref 5–15)
BILIRUBIN TOTAL: 0.8 mg/dL (ref 0.3–1.2)
BUN: 8 mg/dL (ref 6–20)
CO2: 25 mmol/L (ref 22–32)
CREATININE: 0.56 mg/dL (ref 0.44–1.00)
Calcium: 8.9 mg/dL (ref 8.9–10.3)
Chloride: 101 mmol/L (ref 101–111)
GFR calc Af Amer: >60 mL/min (ref >60)
Glucose, Bld: 126 mg/dL — ABNORMAL HIGH (ref 65–99)
POTASSIUM: 3.6 mmol/L (ref 3.5–5.1)
Sodium: 136 mmol/L (ref 135–145)
Total Protein: 7.6 g/dL (ref 6.5–8.1)

## 2017-05-24 LAB — CBC
HCT: 38.3 % (ref 36.0–46.0)
HEMOGLOBIN: 12.5 g/dL (ref 12.0–15.0)
MCH: 29.3 pg (ref 26.0–34.0)
MCHC: 32.6 g/dL (ref 30.0–36.0)
MCV: 89.7 fL (ref 78.0–100.0)
PLATELETS: 327 10*3/uL (ref 150–400)
RBC: 4.27 MIL/uL (ref 3.87–5.11)
RDW: 15.3 % (ref 11.5–15.5)
WBC: 11.7 10*3/uL — AB (ref 4.0–10.5)

## 2017-05-24 LAB — SEDIMENTATION RATE: Sed Rate: 50 mm/hr — ABNORMAL HIGH (ref 0–22)

## 2017-05-24 MED ORDER — SODIUM CHLORIDE 0.9% FLUSH: 3.0000 mL | INTRAVENOUS | Status: DC | PRN

## 2017-05-24 MED ORDER — ENOXAPARIN SODIUM 40 MG/0.4ML ~~LOC~~ SOLN: 40.0000 mg | SUBCUTANEOUS | Status: DC

## 2017-05-24 MED ORDER — PIPERACILLIN-TAZOBACTAM 3.375 G IVPB
3.3750 g | Freq: Three times a day (TID) | INTRAVENOUS | Status: DC
  Administered 2017-05-24 – 2017-05-27 (×8): 3.375 g via INTRAVENOUS
  Filled 2017-05-24 (×8): qty 50

## 2017-05-24 MED ORDER — SODIUM CHLORIDE 0.9% FLUSH
3.0000 mL | Freq: Two times a day (BID) | INTRAVENOUS | Status: DC
  Administered 2017-05-24 – 2017-05-26 (×3): 3 mL via INTRAVENOUS

## 2017-05-24 MED ORDER — HYDRALAZINE HCL 20 MG/ML IJ SOLN: 10.0000 mg | Freq: Four times a day (QID) | INTRAMUSCULAR | Status: DC | PRN

## 2017-05-24 MED ORDER — PIPERACILLIN-TAZOBACTAM 3.375 G IVPB
3.3750 g | Freq: Three times a day (TID) | INTRAVENOUS | Status: DC
  Administered 2017-05-24: 3.375 g via INTRAVENOUS
  Filled 2017-05-24: qty 50

## 2017-05-24 MED ORDER — SODIUM CHLORIDE 0.9 % IV SOLN: 250.0000 mL | INTRAVENOUS | Status: DC | PRN

## 2017-05-24 MED ORDER — VANCOMYCIN HCL 10 G IV SOLR
1250.0000 mg | Freq: Two times a day (BID) | INTRAVENOUS | Status: DC
  Administered 2017-05-24 – 2017-05-27 (×6): 1250 mg via INTRAVENOUS
  Filled 2017-05-24 (×13): qty 1250

## 2017-05-24 MED ORDER — VANCOMYCIN HCL 10 G IV SOLR
1250.0000 mg | Freq: Once | INTRAVENOUS | Status: AC
  Administered 2017-05-24: 1250 mg via INTRAVENOUS
  Filled 2017-05-24: qty 1250

## 2017-05-24 MED ORDER — PIPERACILLIN-TAZOBACTAM 3.375 G IVPB
3.3750 g | Freq: Once | INTRAVENOUS | Status: AC
  Administered 2017-05-24: 3.375 g via INTRAVENOUS
  Filled 2017-05-24: qty 50

## 2017-05-24 MED ORDER — POTASSIUM CHLORIDE CRYS ER 20 MEQ PO TBCR
30.0000 meq | EXTENDED_RELEASE_TABLET | Freq: Once | ORAL | Status: AC
  Administered 2017-05-24: 30 meq via ORAL
  Filled 2017-05-24: qty 2

## 2017-05-24 NOTE — Progress Notes (Signed)
Subjective: She is here with cellulitis of the face and a small abscess. The abscess was attempted to be drained yesterday and that was not possible. She says she feels better and wants to go home. I have discussed her situation with Dr. Graylon Good from infectious disease and we both feel like she probably needs another 24 hours of IV antibiotics awaiting the culture from the abscess.  Objective: Vital signs in last 24 hours: Temp:  [98.6 F (37 C)-99.4 F (37.4 C)] 98.6 F (37 C) (10/15 2219) Pulse Rate:  [66-86] 74 (10/16 0532) Resp:  [16-18] 16 (10/16 0532) BP: (122-157)/(58-84) 122/67 (10/16 0532) SpO2:  [95 %-100 %] 98 % (10/16 0532) Weight:  [99.8 kg (220 lb)] 99.8 kg (220 lb) (10/15 1351) Weight change:     Intake/Output from previous day: 10/15 0701 - 10/16 0700 In: 50 [IV Piggyback:50] Out: -   PHYSICAL EXAM General appearance: alert, cooperative and mild distress Resp: clear to auscultation bilaterally Cardio: regular rate and rhythm, S1, S2 normal, no murmur, click, rub or gallop GI: soft, non-tender; bowel sounds normal; no masses,  no organomegaly Extremities: extremities normal, atraumatic, no cyanosis or edema She has a small abscess near her right eye and has some swelling of the face on both sides  Lab Results:  Results for orders placed or performed during the hospital encounter of 05/23/17 (from the past 48 hour(s))  CBC with Differential     Status: Abnormal   Collection Time: 05/23/17  4:02 PM  Result Value Ref Range   WBC 11.2 (H) 4.0 - 10.5 K/uL   RBC 4.20 3.87 - 5.11 MIL/uL   Hemoglobin 12.3 12.0 - 15.0 g/dL   HCT 37.8 36.0 - 46.0 %   MCV 90.0 78.0 - 100.0 fL   MCH 29.3 26.0 - 34.0 pg   MCHC 32.5 30.0 - 36.0 g/dL   RDW 15.3 11.5 - 15.5 %   Platelets 324 150 - 400 K/uL   Neutrophils Relative % 67 %   Neutro Abs 7.7 1.7 - 7.7 K/uL   Lymphocytes Relative 26 %   Lymphs Abs 2.9 0.7 - 4.0 K/uL   Monocytes Relative 5 %   Monocytes Absolute 0.5 0.1 - 1.0  K/uL   Eosinophils Relative 2 %   Eosinophils Absolute 0.2 0.0 - 0.7 K/uL   Basophils Relative 0 %   Basophils Absolute 0.0 0.0 - 0.1 K/uL  Basic metabolic panel     Status: Abnormal   Collection Time: 05/23/17  4:02 PM  Result Value Ref Range   Sodium 138 135 - 145 mmol/L   Potassium 3.2 (L) 3.5 - 5.1 mmol/L   Chloride 104 101 - 111 mmol/L   CO2 26 22 - 32 mmol/L   Glucose, Bld 107 (H) 65 - 99 mg/dL   BUN 8 6 - 20 mg/dL   Creatinine, Ser 0.67 0.44 - 1.00 mg/dL   Calcium 8.8 (L) 8.9 - 10.3 mg/dL   GFR calc non Af Amer >60 >60 mL/min   GFR calc Af Amer >60 >60 mL/min    Comment: (NOTE) The eGFR has been calculated using the CKD EPI equation. This calculation has not been validated in all clinical situations. eGFR's persistently <60 mL/min signify possible Chronic Kidney Disease.    Anion gap 8 5 - 15  Sedimentation rate     Status: Abnormal   Collection Time: 05/23/17  4:02 PM  Result Value Ref Range   Sed Rate 50 (H) 0 - 22 mm/hr  Comprehensive  metabolic panel     Status: Abnormal   Collection Time: 05/24/17  5:00 AM  Result Value Ref Range   Sodium 136 135 - 145 mmol/L   Potassium 3.6 3.5 - 5.1 mmol/L   Chloride 101 101 - 111 mmol/L   CO2 25 22 - 32 mmol/L   Glucose, Bld 126 (H) 65 - 99 mg/dL   BUN 8 6 - 20 mg/dL   Creatinine, Ser 0.56 0.44 - 1.00 mg/dL   Calcium 8.9 8.9 - 10.3 mg/dL   Total Protein 7.6 6.5 - 8.1 g/dL   Albumin 3.6 3.5 - 5.0 g/dL   AST 14 (L) 15 - 41 U/L   ALT 14 14 - 54 U/L   Alkaline Phosphatase 49 38 - 126 U/L   Total Bilirubin 0.8 0.3 - 1.2 mg/dL   GFR calc non Af Amer >60 >60 mL/min   GFR calc Af Amer >60 >60 mL/min    Comment: (NOTE) The eGFR has been calculated using the CKD EPI equation. This calculation has not been validated in all clinical situations. eGFR's persistently <60 mL/min signify possible Chronic Kidney Disease.    Anion gap 10 5 - 15  CBC     Status: Abnormal   Collection Time: 05/24/17  5:00 AM  Result Value Ref Range    WBC 11.7 (H) 4.0 - 10.5 K/uL   RBC 4.27 3.87 - 5.11 MIL/uL   Hemoglobin 12.5 12.0 - 15.0 g/dL   HCT 38.3 36.0 - 46.0 %   MCV 89.7 78.0 - 100.0 fL   MCH 29.3 26.0 - 34.0 pg   MCHC 32.6 30.0 - 36.0 g/dL   RDW 15.3 11.5 - 15.5 %   Platelets 327 150 - 400 K/uL    ABGS No results for input(s): PHART, PO2ART, TCO2, HCO3 in the last 72 hours.  Invalid input(s): PCO2 CULTURES No results found for this or any previous visit (from the past 240 hour(s)). Studies/Results: Ct Maxillofacial W Contrast  Result Date: 05/23/2017 CLINICAL DATA:  RIGHT eye swelling for 1 day. EXAM: CT MAXILLOFACIAL WITH CONTRAST TECHNIQUE: Multidetector CT imaging of the maxillofacial structures was performed with intravenous contrast. Multiplanar CT image reconstructions were also generated. CONTRAST:  3m ISOVUE-300 IOPAMIDOL (ISOVUE-300) INJECTION 61% COMPARISON:  None. FINDINGS: Osseous: No fracture or mandibular dislocation. No destructive process. Cervical spondylosis C5-C6. Orbits: There is preseptal periorbital soft tissue swelling on the RIGHT but not the LEFT. No postseptal inflammation is evident. Globes are symmetric. Adjacent to the medial canthus of the RIGHT eye, there is a 7 mm diameter, peripherally enhancing low attenuation area, which could represent a small abscess. See image 29 series 2. Sinuses: No sinus pathology.  No blowout injury. Soft tissues: Other than the RIGHT periorbital cellulitis, negative. Limited intracranial: No acute findings.  Partial empty sella. IMPRESSION: Preseptal periorbital soft tissue swelling affecting the RIGHT eye. No postseptal inflammation. Concern for small RIGHT-sided medial canthus superficial abscess, 7 mm in diameter. No underlying sinus disease or osseous injury. Electronically Signed   By: JStaci RighterM.D.   On: 05/23/2017 18:20    Medications:  Prior to Admission:  (Not in a hospital admission) Scheduled: . enoxaparin (LOVENOX) injection  40 mg Subcutaneous  Q24H  . sodium chloride flush  3 mL Intravenous Q12H   Continuous: . sodium chloride    . piperacillin-tazobactam (ZOSYN)  IV    . vancomycin     PLTR:VUYEBXchloride, acetaminophen **OR** acetaminophen, hydrALAZINE, pentafluoroprop-tetrafluoroeth, sodium chloride flush  Assesment: She has cellulitis of  the face and she's better with IV antibiotics but I don't think she's ready to go home. Active Problems:   Cellulitis    Plan: Continue IV antibiotics at least another 24 hours    LOS: 1 day   Tameshia Bonneville L 05/24/2017, 8:50 AM

## 2017-05-24 NOTE — ED Notes (Addendum)
Pt requesting to go home and receive abx with home health. Dr. Juanetta Gosling aware and stated if case management able to coordinate that, pt can be discharged home. Pt aware and requests home health go through [facility]. Primary RN and Case Management aware.

## 2017-05-24 NOTE — ED Notes (Signed)
Received call from Dr Juanetta Gosling states he spoke with infectious disease dr, pt needs to stay in hospital for at least 24 hrs of IV abt. Pt upset due to no bed availability. States she will stay until 5 pm if no bed she will leave

## 2017-05-24 NOTE — Progress Notes (Signed)
Pharmacy Antibiotic Note  [name] is a 46 y.o. female admitted on 05/23/2017 with cellulitis.  Pharmacy has been consulted for Zosyn and Vancomycin dosing.  Plan: Vancomycin 1250mg  IV every 12 hours.  Goal trough 10-15 mcg/mL. Zosyn 3.375g IV q8h (4 hour infusion). F/U cxs and clinical progress Monitor V/S, labs, and levels as indicated  Height: 4\' 7"  (139.7 cm) Weight: 220 lb (99.8 kg) IBW/kg (Calculated) : 34  Temp (24hrs), Avg:98.9 F (37.2 C), Min:98.6 F (37 C), Max:99.4 F (37.4 C)   Recent Labs Lab 05/23/17 1602 05/24/17 0500  WBC 11.2* 11.7*  CREATININE 0.67 0.56    Estimated Creatinine Clearance: 86.3 mL/min (by C-G formula based on SCr of 0.56 mg/dL).    Allergies  Allergen Reactions  . Other     peaches    Antimicrobials this admission: Vancomycin 10/16 >>  Zosyn 10/16 >>   Dose adjustments this admission: N/A  Microbiology results: 10/15 WCx: pending  Thank you for allowing pharmacy to be a part of this patient's care.  Elder Cyphers, BS Pharm D, BCPS Clinical Pharmacist Pager [phone] 05/24/2017 8:15 AM

## 2017-05-24 NOTE — Progress Notes (Signed)
ANTIBIOTIC CONSULT NOTE-Preliminary  Pharmacy Consult for Vancomycin and Zosyn Indication: Cellulitis  Allergies  Allergen Reactions  . Other     peaches    Patient Measurements: Height: 4\' 7"  (139.7 cm) Weight: 220 lb (99.8 kg) IBW/kg (Calculated) : 34  Vital Signs: Temp: 98.6 F (37 C) (10/15 2219) Temp Source: Oral (10/15 2219) BP: 128/58 (10/16 0052) Pulse Rate: 86 (10/16 0052)  Labs:  Recent Labs  05/23/17 1602  WBC 11.2*  HGB 12.3  PLT 324  CREATININE 0.67    Estimated Creatinine Clearance: 86.3 mL/min (by C-G formula based on SCr of 0.67 mg/dL).  No results for input(s): VANCOTROUGH, VANCOPEAK, VANCORANDOM, GENTTROUGH, GENTPEAK, GENTRANDOM, TOBRATROUGH, TOBRAPEAK, TOBRARND, AMIKACINPEAK, AMIKACINTROU, AMIKACIN in the last 72 hours.   Microbiology: No results found for this or any previous visit (from the past 720 hour(s)).  Medical History: Past Medical History:  Diagnosis Date  . Anemia   . Hernia, inguinal   . Medical history non-contributory   . Ventral hernia 10/02/2015    Medications:   Assessment: 46 yo female seen in the ED for facial papule and periorbital soft tissue swelling affecting the right eye. Pharmacy has been asked to dose Vancomycin and Zosyn for cellulitis.  Goal of Therapy:  Vancomycin troughs 10-15 mcg/ml Eradicate infection  Plan:  Preliminary review of pertinent patient information completed.  Protocol will be initiated with doses of Vancomycin 1250 mg IV and Zosyn 3.375 Gm IV.  Jeani Hawking clinical pharmacist will complete review during morning rounds to assess patient and finalize treatment regimen if needed.  Arelia Sneddon, [facility] 05/24/2017,1:58 AM

## 2017-05-24 NOTE — ED Notes (Signed)
Have talked with Esperanza Richters and she has updated me that she has agreed to stay

## 2017-05-24 NOTE — ED Notes (Signed)
Per Case Management, pt has agreed to stay once pt found out IV abx require PICC line at home. Case Management contacted bed control and reported pt would be next to have bed assigned.

## 2017-05-25 DIAGNOSIS — L03211 Cellulitis of face: Principal | ICD-10-CM

## 2017-05-25 LAB — C DIFFICILE QUICK SCREEN W PCR REFLEX
C DIFFICILE (CDIFF) INTERP: NOT DETECTED
C Diff antigen: NEGATIVE
C Diff toxin: NEGATIVE

## 2017-05-25 LAB — HIV ANTIBODY (ROUTINE TESTING W REFLEX): HIV SCREEN 4TH GENERATION: NONREACTIVE

## 2017-05-25 MED ORDER — TRAMADOL HCL 50 MG PO TABS
50.0000 mg | ORAL_TABLET | Freq: Four times a day (QID) | ORAL | Status: DC | PRN
  Administered 2017-05-25: 50 mg via ORAL
  Filled 2017-05-25: qty 1

## 2017-05-25 MED ORDER — LOPERAMIDE HCL 2 MG PO CAPS
2.0000 mg | ORAL_CAPSULE | ORAL | Status: DC | PRN
  Administered 2017-05-25 – 2017-05-27 (×3): 2 mg via ORAL
  Filled 2017-05-25 (×3): qty 1

## 2017-05-25 MED ORDER — LOPERAMIDE HCL 2 MG PO CAPS
2.0000 mg | ORAL_CAPSULE | Freq: Once | ORAL | Status: AC
  Administered 2017-05-25: 2 mg via ORAL
  Filled 2017-05-25: qty 1

## 2017-05-25 NOTE — Consult Note (Signed)
Reason for Consult:cellulitis, face Referring Physician: Dr. Larry Hernandez is an 46 y.o. female.  HPI: patient is a 46 year old black female who was referred to my care for evaluation and treatment of cellulitis and the periorbital region. She was found to have a small partial presentalong the bridge of the nose, medial to the right medial canthus.Dr. Luan Hernandez was able to express some sebum. She has had significant swelling overnight since her admission. She has a pain of 4 out of 10.  Past Medical History:  Diagnosis Date  . Anemia   . Hernia, inguinal   . Medical history non-contributory   . Ventral hernia 10/02/2015    Past Surgical History:  Procedure Laterality Date  . ABDOMINAL HYSTERECTOMY    . CESAREAN SECTION     x 2  . ESOPHAGOGASTRODUODENOSCOPY N/A 11/28/2013   Procedure: ESOPHAGOGASTRODUODENOSCOPY (EGD);  Surgeon: Bridget Dolin, MD;  Location: [facility];  Service: Endoscopy;  Laterality: N/A;  12:15  . EYE SURGERY    . INCISIONAL HERNIA REPAIR N/A 11/24/2015   Procedure: [name] HERNIORRHAPHY WITH MESH;  Surgeon: Bridget Signs, MD;  Location: [facility];  Service: General;  Laterality: N/A;  . INSERTION OF MESH N/A 11/24/2015   Procedure: INSERTION OF MESH;  Surgeon: Bridget Signs, MD;  Location: [facility];  Service: General;  Laterality: N/A;  . TONSILLECTOMY      Family History  Problem Relation Age of Onset  . Cancer Mother        lung  . Other Father        was shot and killed  . Hyperlipidemia Sister   . Dementia Maternal Grandmother   . Heart attack Maternal Grandfather   . Colon cancer Neg Hx     Social History:  reports that she has never smoked. She has never used smokeless tobacco. She reports that she drinks alcohol. She reports that she does not use drugs.  Allergies:  Allergies  Allergen Reactions  . Other     peaches    Medications:  Scheduled: . enoxaparin (LOVENOX) injection  40 mg Subcutaneous Q24H  . sodium chloride flush  3 mL  Intravenous Q12H    Results for orders placed or performed during the hospital encounter of 05/23/17 (from the past 48 hour(s))  CBC with Differential     Status: Abnormal   Collection Time: 05/23/17  4:02 PM  Result Value Ref Range   WBC 11.2 (H) 4.0 - 10.5 K/uL   RBC 4.20 3.87 - 5.11 MIL/uL   Hemoglobin 12.3 12.0 - 15.0 g/dL   HCT 37.8 36.0 - 46.0 %   MCV 90.0 78.0 - 100.0 fL   MCH 29.3 26.0 - 34.0 pg   MCHC 32.5 30.0 - 36.0 g/dL   RDW 15.3 11.5 - 15.5 %   Platelets 324 150 - 400 K/uL   Neutrophils Relative % 67 %   Neutro Abs 7.7 1.7 - 7.7 K/uL   Lymphocytes Relative 26 %   Lymphs Abs 2.9 0.7 - 4.0 K/uL   Monocytes Relative 5 %   Monocytes Absolute 0.5 0.1 - 1.0 K/uL   Eosinophils Relative 2 %   Eosinophils Absolute 0.2 0.0 - 0.7 K/uL   Basophils Relative 0 %   Basophils Absolute 0.0 0.0 - 0.1 K/uL  Basic metabolic panel     Status: Abnormal   Collection Time: 05/23/17  4:02 PM  Result Value Ref Range   Sodium 138 135 - 145 mmol/L   Potassium 3.2 (L)  3.5 - 5.1 mmol/L   Chloride 104 101 - 111 mmol/L   CO2 26 22 - 32 mmol/L   Glucose, Bld 107 (H) 65 - 99 mg/dL   BUN 8 6 - 20 mg/dL   Creatinine, Ser 0.67 0.44 - 1.00 mg/dL   Calcium 8.8 (L) 8.9 - 10.3 mg/dL   GFR calc non Af Amer >60 >60 mL/min   GFR calc Af Amer >60 >60 mL/min    Comment: (NOTE) The eGFR has been calculated using the CKD EPI equation. This calculation has not been validated in all clinical situations. eGFR's persistently <60 mL/min signify possible Chronic Kidney Disease.    Anion gap 8 5 - 15  HIV antibody (Routine Testing)     Status: None   Collection Time: 05/23/17  4:02 PM  Result Value Ref Range   HIV Screen 4th Generation wRfx Non Reactive Non Reactive    Comment: (NOTE) Performed At: [facility] [place], [place] [id number] Lindon Romp MD UV:[id number]   Sedimentation rate     Status: Abnormal   Collection Time: 05/23/17  4:02 PM  Result Value Ref Range    Sed Rate 50 (H) 0 - 22 mm/hr  Wound or Superficial Culture     Status: None (Preliminary result)   Collection Time: 05/23/17  7:48 PM  Result Value Ref Range   Specimen Description FACE    Special Requests Normal    Gram Stain      NO WBC SEEN NO ORGANISMS SEEN Performed at [facility], [place] [address] [place], [place] [address]    Culture PENDING    Report Status PENDING   Comprehensive metabolic panel     Status: Abnormal   Collection Time: 05/24/17  5:00 AM  Result Value Ref Range   Sodium 136 135 - 145 mmol/L   Potassium 3.6 3.5 - 5.1 mmol/L   Chloride 101 101 - 111 mmol/L   CO2 25 22 - 32 mmol/L   Glucose, Bld 126 (H) 65 - 99 mg/dL   BUN 8 6 - 20 mg/dL   Creatinine, Ser 0.56 0.44 - 1.00 mg/dL   Calcium 8.9 8.9 - 10.3 mg/dL   Total Protein 7.6 6.5 - 8.1 g/dL   Albumin 3.6 3.5 - 5.0 g/dL   AST 14 (L) 15 - 41 U/L   ALT 14 14 - 54 U/L   Alkaline Phosphatase 49 38 - 126 U/L   Total Bilirubin 0.8 0.3 - 1.2 mg/dL   GFR calc non Af Amer >60 >60 mL/min   GFR calc Af Amer >60 >60 mL/min    Comment: (NOTE) The eGFR has been calculated using the CKD EPI equation. This calculation has not been validated in all clinical situations. eGFR's persistently <60 mL/min signify possible Chronic Kidney Disease.    Anion gap 10 5 - 15  CBC     Status: Abnormal   Collection Time: 05/24/17  5:00 AM  Result Value Ref Range   WBC 11.7 (H) 4.0 - 10.5 K/uL   RBC 4.27 3.87 - 5.11 MIL/uL   Hemoglobin 12.5 12.0 - 15.0 g/dL   HCT 38.3 36.0 - 46.0 %   MCV 89.7 78.0 - 100.0 fL   MCH 29.3 26.0 - 34.0 pg   MCHC 32.6 30.0 - 36.0 g/dL   RDW 15.3 11.5 - 15.5 %   Platelets 327 150 - 400 K/uL    Ct Maxillofacial W Contrast  Result Date: 05/23/2017 CLINICAL DATA:  RIGHT eye  swelling for 1 day. EXAM: CT MAXILLOFACIAL WITH CONTRAST TECHNIQUE: Multidetector CT imaging of the maxillofacial structures was performed with intravenous contrast. Multiplanar CT image reconstructions were also  generated. CONTRAST:  34m ISOVUE-300 IOPAMIDOL (ISOVUE-300) INJECTION 61% COMPARISON:  None. FINDINGS: Osseous: No fracture or mandibular dislocation. No destructive process. Cervical spondylosis C5-C6. Orbits: There is preseptal periorbital soft tissue swelling on the RIGHT but not the LEFT. No postseptal inflammation is evident. Globes are symmetric. Adjacent to the medial canthus of the RIGHT eye, there is a 7 mm diameter, peripherally enhancing low attenuation area, which could represent a small abscess. See image 29 series 2. Sinuses: No sinus pathology.  No blowout injury. Soft tissues: Other than the RIGHT periorbital cellulitis, negative. Limited intracranial: No acute findings.  Partial empty sella. IMPRESSION: Preseptal periorbital soft tissue swelling affecting the RIGHT eye. No postseptal inflammation. Concern for small RIGHT-sided medial canthus superficial abscess, 7 mm in diameter. No underlying sinus disease or osseous injury. Electronically Signed   By: JStaci RighterM.D.   On: 05/23/2017 18:20    ROS:  Pertinent items are noted in HPI.  Blood pressure (!) 148/82, pulse 74, temperature 98 F (36.7 C), temperature source Oral, resp. rate 20, height 4' 7"  (1.397 m), weight 221 lb 9 oz (100.5 kg), SpO2 100 %. Physical Exam: pleasant black female in no acute distress HEENT: Periorbital swelling present with a small pustule along the right bridge of the nose. I was able to express a small amount of purulent fluid until clear. CT images of the face personally reviewed Assessment/Plan: Impression: Cellulitis of the face Plan: Continue warm compresses and IV antibiotics. This should clear without any further surgical intervention. This was discussed with Dr. HLuan Hernandez  MAviva Signs10/17/2018, 9:54 AM

## 2017-05-25 NOTE — Progress Notes (Signed)
Subjective: She says she feels like the area around her eye and has come to a "head". She's having more pain and discomfort. No fever overnight. She still has a lot of swelling of her face  Objective: Vital signs in last 24 hours: Temp:  [98 F (36.7 C)-98.1 F (36.7 C)] 98 F (36.7 C) (10/17 0659) Pulse Rate:  [61-83] 74 (10/17 0659) Resp:  [18-20] 20 (10/17 0659) BP: (121-148)/(61-82) 148/82 (10/17 0659) SpO2:  [94 %-100 %] 100 % (10/17 0659) Weight:  [100.5 kg (221 lb 9 oz)] 100.5 kg (221 lb 9 oz) (10/17 0500) Weight change: 0.709 kg (1 lb 9 oz) Last BM Date: 05/24/17  Intake/Output from previous day: 10/16 0701 - 10/17 0700 In: 300 [IV Piggyback:300] Out: -   PHYSICAL EXAM General appearance: alert, cooperative and mild distress Resp: clear to auscultation bilaterally Cardio: regular rate and rhythm, S1, S2 normal, no murmur, click, rub or gallop GI: soft, non-tender; bowel sounds normal; no masses,  no organomegaly Extremities: extremities normal, atraumatic, no cyanosis or edema She has what appears to be a small abscess between her nose and right eye. It does have a white discoloration at the top. She has substantial swelling of the face bilaterally.  Lab Results:  Results for orders placed or performed during the hospital encounter of 05/23/17 (from the past 48 hour(s))  CBC with Differential     Status: Abnormal   Collection Time: 05/23/17  4:02 PM  Result Value Ref Range   WBC 11.2 (H) 4.0 - 10.5 K/uL   RBC 4.20 3.87 - 5.11 MIL/uL   Hemoglobin 12.3 12.0 - 15.0 g/dL   HCT 37.8 36.0 - 46.0 %   MCV 90.0 78.0 - 100.0 fL   MCH 29.3 26.0 - 34.0 pg   MCHC 32.5 30.0 - 36.0 g/dL   RDW 15.3 11.5 - 15.5 %   Platelets 324 150 - 400 K/uL   Neutrophils Relative % 67 %   Neutro Abs 7.7 1.7 - 7.7 K/uL   Lymphocytes Relative 26 %   Lymphs Abs 2.9 0.7 - 4.0 K/uL   Monocytes Relative 5 %   Monocytes Absolute 0.5 0.1 - 1.0 K/uL   Eosinophils Relative 2 %   Eosinophils  Absolute 0.2 0.0 - 0.7 K/uL   Basophils Relative 0 %   Basophils Absolute 0.0 0.0 - 0.1 K/uL  Basic metabolic panel     Status: Abnormal   Collection Time: 05/23/17  4:02 PM  Result Value Ref Range   Sodium 138 135 - 145 mmol/L   Potassium 3.2 (L) 3.5 - 5.1 mmol/L   Chloride 104 101 - 111 mmol/L   CO2 26 22 - 32 mmol/L   Glucose, Bld 107 (H) 65 - 99 mg/dL   BUN 8 6 - 20 mg/dL   Creatinine, Ser 0.67 0.44 - 1.00 mg/dL   Calcium 8.8 (L) 8.9 - 10.3 mg/dL   GFR calc non Af Amer >60 >60 mL/min   GFR calc Af Amer >60 >60 mL/min    Comment: (NOTE) The eGFR has been calculated using the CKD EPI equation. This calculation has not been validated in all clinical situations. eGFR's persistently <60 mL/min signify possible Chronic Kidney Disease.    Anion gap 8 5 - 15  HIV antibody (Routine Testing)     Status: None   Collection Time: 05/23/17  4:02 PM  Result Value Ref Range   HIV Screen 4th Generation wRfx Non Reactive Non Reactive    Comment: (NOTE) Performed  At: [facility] [place], [place] [id number] Lindon Romp MD RC:[id number]   Sedimentation rate     Status: Abnormal   Collection Time: 05/23/17  4:02 PM  Result Value Ref Range   Sed Rate 50 (H) 0 - 22 mm/hr  Wound or Superficial Culture     Status: None (Preliminary result)   Collection Time: 05/23/17  7:48 PM  Result Value Ref Range   Specimen Description FACE    Special Requests Normal    Gram Stain      NO WBC SEEN NO ORGANISMS SEEN Performed at [facility], [place] [address], [place], [place] [address]    Culture PENDING    Report Status PENDING   Comprehensive metabolic panel     Status: Abnormal   Collection Time: 05/24/17  5:00 AM  Result Value Ref Range   Sodium 136 135 - 145 mmol/L   Potassium 3.6 3.5 - 5.1 mmol/L   Chloride 101 101 - 111 mmol/L   CO2 25 22 - 32 mmol/L   Glucose, Bld 126 (H) 65 - 99 mg/dL   BUN 8 6 - 20 mg/dL   Creatinine, Ser 0.56 0.44 - 1.00 mg/dL    Calcium 8.9 8.9 - 10.3 mg/dL   Total Protein 7.6 6.5 - 8.1 g/dL   Albumin 3.6 3.5 - 5.0 g/dL   AST 14 (L) 15 - 41 U/L   ALT 14 14 - 54 U/L   Alkaline Phosphatase 49 38 - 126 U/L   Total Bilirubin 0.8 0.3 - 1.2 mg/dL   GFR calc non Af Amer >60 >60 mL/min   GFR calc Af Amer >60 >60 mL/min    Comment: (NOTE) The eGFR has been calculated using the CKD EPI equation. This calculation has not been validated in all clinical situations. eGFR's persistently <60 mL/min signify possible Chronic Kidney Disease.    Anion gap 10 5 - 15  CBC     Status: Abnormal   Collection Time: 05/24/17  5:00 AM  Result Value Ref Range   WBC 11.7 (H) 4.0 - 10.5 K/uL   RBC 4.27 3.87 - 5.11 MIL/uL   Hemoglobin 12.5 12.0 - 15.0 g/dL   HCT 38.3 36.0 - 46.0 %   MCV 89.7 78.0 - 100.0 fL   MCH 29.3 26.0 - 34.0 pg   MCHC 32.6 30.0 - 36.0 g/dL   RDW 15.3 11.5 - 15.5 %   Platelets 327 150 - 400 K/uL    ABGS No results for input(s): PHART, PO2ART, TCO2, HCO3 in the last 72 hours.  Invalid input(s): PCO2 CULTURES Recent Results (from the past 240 hour(s))  Wound or Superficial Culture     Status: None (Preliminary result)   Collection Time: 05/23/17  7:48 PM  Result Value Ref Range Status   Specimen Description FACE  Final   Special Requests Normal  Final   Gram Stain   Final    NO WBC SEEN NO ORGANISMS SEEN Performed at [facility], [address], [place],  [address]    Culture PENDING  Incomplete   Report Status PENDING  Incomplete   Studies/Results: Ct Maxillofacial W Contrast  Result Date: 05/23/2017 CLINICAL DATA:  RIGHT eye swelling for 1 day. EXAM: CT MAXILLOFACIAL WITH CONTRAST TECHNIQUE: Multidetector CT imaging of the maxillofacial structures was performed with intravenous contrast. Multiplanar CT image reconstructions were also generated. CONTRAST:  74m ISOVUE-300 IOPAMIDOL (ISOVUE-300) INJECTION 61% COMPARISON:  None. FINDINGS: Osseous: No fracture or mandibular  dislocation. No  destructive process. Cervical spondylosis C5-C6. Orbits: There is preseptal periorbital soft tissue swelling on the RIGHT but not the LEFT. No postseptal inflammation is evident. Globes are symmetric. Adjacent to the medial canthus of the RIGHT eye, there is a 7 mm diameter, peripherally enhancing low attenuation area, which could represent a small abscess. See image 29 series 2. Sinuses: No sinus pathology.  No blowout injury. Soft tissues: Other than the RIGHT periorbital cellulitis, negative. Limited intracranial: No acute findings.  Partial empty sella. IMPRESSION: Preseptal periorbital soft tissue swelling affecting the RIGHT eye. No postseptal inflammation. Concern for small RIGHT-sided medial canthus superficial abscess, 7 mm in diameter. No underlying sinus disease or osseous injury. Electronically Signed   By: Staci Righter M.D.   On: 05/23/2017 18:20    Medications:  Prior to Admission:  Prescriptions Prior to Admission  Medication Sig Dispense Refill Last Dose  . phentermine 37.5 MG capsule Take 1 capsule (37.5 mg total) by mouth every morning. 30 capsule 0 05/23/2017 at Unknown time   Scheduled: . enoxaparin (LOVENOX) injection  40 mg Subcutaneous Q24H  . sodium chloride flush  3 mL Intravenous Q12H   Continuous: . sodium chloride    . piperacillin-tazobactam (ZOSYN)  IV Stopped (05/25/17 0600)  . vancomycin Stopped (05/25/17 0240)   SJW:TGRMBO chloride, acetaminophen **OR** acetaminophen, hydrALAZINE, pentafluoroprop-tetrafluoroeth, sodium chloride flush  Assesment: She has cellulitis and probable superficial abscess of the face. Using manual pressure I was able to get some purulent material out but it was only a very small amount. Treated with IV antibiotics. Culture is pending but did not show any organisms on Gram stain. Active Problems:   Cellulitis    Plan: Request general surgical consultation to see if they can help. Continue IV antibiotics    LOS: 2 days    Aleecia Tapia L 05/25/2017, 8:01 AM

## 2017-05-26 NOTE — Progress Notes (Signed)
Subjective: She says she feels a little better. She's gotten more pus out of the presumed abscess. She has no other new complaints.  Objective: Vital signs in last 24 hours: Temp:  [98.2 F (36.8 C)-98.9 F (37.2 C)] 98.2 F (36.8 C) (10/18 0631) Pulse Rate:  [74-85] 85 (10/18 0631) Resp:  [20] 20 (10/18 0631) BP: (122-144)/(67-72) 122/72 (10/18 0631) SpO2:  [99 %-100 %] 100 % (10/18 0631) Weight:  [100.3 kg (221 lb 1.9 oz)] 100.3 kg (221 lb 1.9 oz) (10/18 0500) Weight change: -0.2 kg (-7.1 oz) Last BM Date: 05/25/17  Intake/Output from previous day: 10/17 0701 - 10/18 0700 In: 1370 [P.O.:720; IV Piggyback:650] Out: -   PHYSICAL EXAM General appearance: alert, cooperative and mild distress Resp: clear to auscultation bilaterally Cardio: regular rate and rhythm, S1, S2 normal, no murmur, click, rub or gallop GI: soft, non-tender; bowel sounds normal; no masses,  no organomegaly Extremities: extremities normal, atraumatic, no cyanosis or edema thre small abscess is draining more purulent material. Her face is less swollen  Lab Results:  Results for orders placed or performed during the hospital encounter of 05/23/17 (from the past 48 hour(s))  C difficile quick scan w PCR reflex     Status: None   Collection Time: 05/25/17  9:00 AM  Result Value Ref Range   C Diff antigen NEGATIVE NEGATIVE   C Diff toxin NEGATIVE NEGATIVE   C Diff interpretation No C. difficile detected.     ABGS No results for input(s): PHART, PO2ART, TCO2, HCO3 in the last 72 hours.  Invalid input(s): PCO2 CULTURES Recent Results (from the past 240 hour(s))  Wound or Superficial Culture     Status: None (Preliminary result)   Collection Time: 05/23/17  7:48 PM  Result Value Ref Range Status   Specimen Description FACE  Final   Special Requests Normal  Final   Gram Stain NO WBC SEEN NO ORGANISMS SEEN   Final   Culture   Final    NO GROWTH 1 DAY Performed at [facility], [address] [place], [place] [address]    Report Status PENDING  Incomplete  C difficile quick scan w PCR reflex     Status: None   Collection Time: 05/25/17  9:00 AM  Result Value Ref Range Status   C Diff antigen NEGATIVE NEGATIVE Final   C Diff toxin NEGATIVE NEGATIVE Final   C Diff interpretation No C. difficile detected.  Final   Studies/Results: No results found.  Medications:  Prior to Admission:  Prescriptions Prior to Admission  Medication Sig Dispense Refill Last Dose  . phentermine 37.5 MG capsule Take 1 capsule (37.5 mg total) by mouth every morning. 30 capsule 0 05/23/2017 at Unknown time   Scheduled: . enoxaparin (LOVENOX) injection  40 mg Subcutaneous Q24H  . sodium chloride flush  3 mL Intravenous Q12H   Continuous: . sodium chloride    . piperacillin-tazobactam (ZOSYN)  IV Stopped (05/26/17 0600)  . vancomycin Stopped (05/26/17 0330)   MCN:OBSJGG chloride, acetaminophen **OR** acetaminophen, hydrALAZINE, loperamide, pentafluoroprop-tetrafluoroeth, sodium chloride flush, traMADol  Assesment:cellulitis of face is better but she still has significant swelling. I don't think she is quite ready to go home. Her diarrhea is better. She was negative for C. Difficile. Active Problems:   Cellulitis    Plan:continue treatments. Try warm compresses to see if we can get more material out of the abscess    LOS: 3 days   Bernard Slayden L 05/26/2017, 8:32 AM

## 2017-05-27 LAB — BASIC METABOLIC PANEL
Anion gap: 9 (ref 5–15)
BUN: 9 mg/dL (ref 6–20)
CALCIUM: 8.3 mg/dL — AB (ref 8.9–10.3)
CO2: 26 mmol/L (ref 22–32)
CREATININE: 0.68 mg/dL (ref 0.44–1.00)
Chloride: 104 mmol/L (ref 101–111)
GFR calc Af Amer: >60 mL/min (ref >60)
GLUCOSE: 95 mg/dL (ref 65–99)
POTASSIUM: 3.4 mmol/L — AB (ref 3.5–5.1)
SODIUM: 139 mmol/L (ref 135–145)

## 2017-05-27 MED ORDER — DOXYCYCLINE HYCLATE 100 MG PO CAPS: 100.0000 mg | ORAL_CAPSULE | Freq: Two times a day (BID) | ORAL | 0 refills | Status: DC

## 2017-05-27 NOTE — Care Management Note (Signed)
Case Management Note  Patient Details  Name: [name] MRN: [id number] Date of Birth: Apr 10, 1971  Subjective/Objective:                 Admitted with facial abscess. Pt seen in ED for discussion of home IV abx infusion. Chart reviewed for CM needs at DC. Pt going home on PO abx. Has PCP, drives, employed FT at [employer] for [employer] company.    Action/Plan: DC home today with self care. No CM needs noted at DC.  Expected Discharge Date:  05/27/17               Expected Discharge Plan:  Home/Self Care  In-House Referral:  NA  Discharge planning Services  CM Consult  Post Acute Care Choice:  NA Choice offered to:  NA  Status of Service:  Completed, signed off  Malcolm Metro, RN 05/27/2017, 10:42 AM

## 2017-05-27 NOTE — Progress Notes (Signed)
Subjective: She says she feels much better and wants to go home. She is still having some purulent drainage from the abscess. Culture of the drainage is still pending but no organisms were seen. Her potassium level this morning is 3.4.  Objective: Vital signs in last 24 hours: Temp:  [98 F (36.7 C)-98.2 F (36.8 C)] 98 F (36.7 C) (10/19 0534) Pulse Rate:  [70-76] 70 (10/19 0534) Resp:  [18-20] 18 (10/19 0534) BP: (114-127)/(59-63) 114/59 (10/19 0534) SpO2:  [97 %-99 %] 99 % (10/19 0534) Weight:  [100.1 kg (220 lb 10.9 oz)] 100.1 kg (220 lb 10.9 oz) (10/19 0534) Weight change: -0.2 kg (-7.1 oz) Last BM Date: 05/26/17  Intake/Output from previous day: 10/18 0701 - 10/19 0700 In: 1370 [P.O.:720; IV Piggyback:650] Out: -   PHYSICAL EXAM General appearance: alert, cooperative and no distress Resp: clear to auscultation bilaterally Cardio: regular rate and rhythm, S1, S2 normal, no murmur, click, rub or gallop GI: soft, non-tender; bowel sounds normal; no masses,  no organomegaly Extremities: extremities normal, atraumatic, no cyanosis or edema The small abscess near her right eye is much improved. There is still some drainage of what is mostly serous material now. The swelling of her face is much improved.  Lab Results:  Results for orders placed or performed during the hospital encounter of 05/23/17 (from the past 48 hour(s))  C difficile quick scan w PCR reflex     Status: None   Collection Time: 05/25/17  9:00 AM  Result Value Ref Range   C Diff antigen NEGATIVE NEGATIVE   C Diff toxin NEGATIVE NEGATIVE   C Diff interpretation No C. difficile detected.   Basic metabolic panel     Status: Abnormal   Collection Time: 05/27/17  4:16 AM  Result Value Ref Range   Sodium 139 135 - 145 mmol/L   Potassium 3.4 (L) 3.5 - 5.1 mmol/L   Chloride 104 101 - 111 mmol/L   CO2 26 22 - 32 mmol/L   Glucose, Bld 95 65 - 99 mg/dL   BUN 9 6 - 20 mg/dL   Creatinine, Ser 0.68 0.44 - 1.00 mg/dL    Calcium 8.3 (L) 8.9 - 10.3 mg/dL   GFR calc non Af Amer >60 >60 mL/min   GFR calc Af Amer >60 >60 mL/min    Comment: (NOTE) The eGFR has been calculated using the CKD EPI equation. This calculation has not been validated in all clinical situations. eGFR's persistently <60 mL/min signify possible Chronic Kidney Disease.    Anion gap 9 5 - 15    ABGS No results for input(s): PHART, PO2ART, TCO2, HCO3 in the last 72 hours.  Invalid input(s): PCO2 CULTURES Recent Results (from the past 240 hour(s))  Wound or Superficial Culture     Status: None (Preliminary result)   Collection Time: 05/23/17  7:48 PM  Result Value Ref Range Status   Specimen Description FACE  Final   Special Requests Normal  Final   Gram Stain NO WBC SEEN NO ORGANISMS SEEN   Final   Culture   Final    CULTURE REINCUBATED FOR BETTER GROWTH Performed at [facility], [address], [place], [place] [address]    Report Status PENDING  Incomplete  C difficile quick scan w PCR reflex     Status: None   Collection Time: 05/25/17  9:00 AM  Result Value Ref Range Status   C Diff antigen NEGATIVE NEGATIVE Final   C Diff toxin NEGATIVE NEGATIVE  Final   C Diff interpretation No C. difficile detected.  Final   Studies/Results: No results found.  Medications:  Prior to Admission:  Prescriptions Prior to Admission  Medication Sig Dispense Refill Last Dose  . phentermine 37.5 MG capsule Take 1 capsule (37.5 mg total) by mouth every morning. 30 capsule 0 05/23/2017 at Unknown time   Scheduled: . enoxaparin (LOVENOX) injection  40 mg Subcutaneous Q24H  . sodium chloride flush  3 mL Intravenous Q12H   Continuous: . sodium chloride    . piperacillin-tazobactam (ZOSYN)  IV Stopped (05/27/17 0548)  . vancomycin Stopped (05/27/17 0309)   WRK:YBTVDF chloride, acetaminophen **OR** acetaminophen, hydrALAZINE, loperamide, pentafluoroprop-tetrafluoroeth, sodium chloride flush, traMADol  Assesment: She was  admitted with cellulitis of her face from an abscess. She is much better. She's ready for discharge. I will send her home on doxycycline with plans to change that depending on culture results. Active Problems:   Cellulitis    Plan: Discharge home today    LOS: 4 days   Inaaya Vellucci L 05/27/2017, 8:26 AM

## 2017-05-27 NOTE — Discharge Summary (Signed)
Physician Discharge Summary  Patient ID: [name] MRN: [id number] DOB/AGE: Aug 23, 1970 46 y.o. Primary Care Physician:Chella Chapdelaine, Ramon Dredge, MD Admit date: 05/23/2017 Discharge date: 05/27/2017    Discharge Diagnoses:   Active Problems:   Cellulitis Abscess of face  Allergies as of 05/27/2017      Reactions   Other    peaches      Medication List    TAKE these medications   doxycycline 100 MG capsule Commonly known as:  VIBRAMYCIN Take 1 capsule (100 mg total) by mouth 2 (two) times daily.   phentermine 37.5 MG capsule Take 1 capsule (37.5 mg total) by mouth every morning.       Discharged Condition:Improved    Consults: Gen. surgery, Dr. Lovell Sheehan  Significant Diagnostic Studies: Ct Maxillofacial W Contrast  Result Date: 05/23/2017 CLINICAL DATA:  RIGHT eye swelling for 1 day. EXAM: CT MAXILLOFACIAL WITH CONTRAST TECHNIQUE: Multidetector CT imaging of the maxillofacial structures was performed with intravenous contrast. Multiplanar CT image reconstructions were also generated. CONTRAST:  41mL ISOVUE-300 IOPAMIDOL (ISOVUE-300) INJECTION 61% COMPARISON:  None. FINDINGS: Osseous: No fracture or mandibular dislocation. No destructive process. Cervical spondylosis C5-C6. Orbits: There is preseptal periorbital soft tissue swelling on the RIGHT but not the LEFT. No postseptal inflammation is evident. Globes are symmetric. Adjacent to the medial canthus of the RIGHT eye, there is a 7 mm diameter, peripherally enhancing low attenuation area, which could represent a small abscess. See image 29 series 2. Sinuses: No sinus pathology.  No blowout injury. Soft tissues: Other than the RIGHT periorbital cellulitis, negative. Limited intracranial: No acute findings.  Partial empty sella. IMPRESSION: Preseptal periorbital soft tissue swelling affecting the RIGHT eye. No postseptal inflammation. Concern for small RIGHT-sided medial canthus superficial abscess, 7 mm in diameter. No  underlying sinus disease or osseous injury. Electronically Signed   By: Elsie Stain M.D.   On: 05/23/2017 18:20    Lab Results: Basic Metabolic Panel:  Recent Labs  97/67/34 0416  NA 139  K 3.4*  CL 104  CO2 26  GLUCOSE 95  BUN 9  CREATININE 0.68  CALCIUM 8.3*   Liver Function Tests: No results for input(s): AST, ALT, ALKPHOS, BILITOT, PROT, ALBUMIN in the last 72 hours.   CBC: No results for input(s): WBC, NEUTROABS, HGB, HCT, MCV, PLT in the last 72 hours.  Recent Results (from the past 240 hour(s))  Wound or Superficial Culture     Status: None (Preliminary result)   Collection Time: 05/23/17  7:48 PM  Result Value Ref Range Status   Specimen Description FACE  Final   Special Requests Normal  Final   Gram Stain NO WBC SEEN NO ORGANISMS SEEN   Final   Culture   Final    CULTURE REINCUBATED FOR BETTER GROWTH Performed at [facility], [address], [place], [place] [address]    Report Status PENDING  Incomplete  C difficile quick scan w PCR reflex     Status: None   Collection Time: 05/25/17  9:00 AM  Result Value Ref Range Status   C Diff antigen NEGATIVE NEGATIVE Final   C Diff toxin NEGATIVE NEGATIVE Final   C Diff interpretation No C. difficile detected.  Final     Hospital Course: This is a 46 year old who came to the emergency department with facial swelling. She had noticed a skin lesion close to her right eye that has been present for many years. However prior to admission it became inflamed warm and her face  was swollen. When she was seen in the emergency department she was found to have significant edema of her face and CT showed what looked like a small abscess with a lot of soft tissue swelling. She was felt to have cellulitis of the face with an abscess. The abscess was very small and it was drained with a needle and culture is still pending. No organisms were seen. She continued to have inflammation of her face and had more drainage. Dr. Lovell SheehanJenkins  was consulted and he was able to get more drainage out of the lesion. She was treated with vancomycin and Zosyn and improved. By the time of discharge her facial swelling was much improved she still had some serous drainage from the abscess but everything was better.  Discharge Exam: Blood pressure (!) 114/59, pulse 70, temperature 98 F (36.7 C), temperature source Oral, resp. rate 18, height 4\' 7"  (1.397 m), weight 100.1 kg (220 lb 10.9 oz), SpO2 99 %. She is awake and alert. The abscess is much smaller and is draining serous material. Facial swelling is essentially gone.  Disposition: Home  Discharge Instructions    Call MD for:  persistant nausea and vomiting    Complete by:  As directed    Call MD for:  severe uncontrolled pain    Complete by:  As directed    Call MD for:  temperature >100.4    Complete by:  As directed    Diet - low sodium heart healthy    Complete by:  As directed    Increase activity slowly    Complete by:  As directed         Signed: Jaylee Freeze L   05/27/2017, 8:49 AM

## 2017-05-27 NOTE — Progress Notes (Signed)
Discharge instructions given, verbalized understanding, out in stable condition ambulatory with staff. 

## 2017-05-29 LAB — AEROBIC CULTURE  (SUPERFICIAL SPECIMEN)
CULTURE: NORMAL
GRAM STAIN: NONE SEEN

## 2017-05-29 LAB — AEROBIC CULTURE W GRAM STAIN (SUPERFICIAL SPECIMEN): Special Requests: NORMAL

## 2017-06-03 ENCOUNTER — Telehealth: Payer: Self-pay | Admitting: Adult Health

## 2017-06-06 DIAGNOSIS — L03211 Cellulitis of face: Secondary | ICD-10-CM | POA: Diagnosis not present

## 2017-06-06 DIAGNOSIS — R451 Restlessness and agitation: Secondary | ICD-10-CM | POA: Diagnosis not present

## 2017-06-06 DIAGNOSIS — M48061 Spinal stenosis, lumbar region without neurogenic claudication: Secondary | ICD-10-CM | POA: Diagnosis not present

## 2017-06-06 MED ORDER — FLUCONAZOLE 150 MG PO TABS: ORAL_TABLET | ORAL | 1 refills | Status: DC

## 2017-06-06 NOTE — Telephone Encounter (Signed)
Pt aware diflucan sent to [facility]

## 2017-06-23 DIAGNOSIS — L720 Epidermal cyst: Secondary | ICD-10-CM | POA: Diagnosis not present

## 2020-02-07 ENCOUNTER — Other Ambulatory Visit: Payer: Self-pay | Admitting: Adult Health

## 2020-02-07 ENCOUNTER — Telehealth: Payer: Self-pay | Admitting: Adult Health

## 2020-02-07 MED ORDER — FLUCONAZOLE 150 MG PO TABS: ORAL_TABLET | ORAL | 1 refills | Status: DC

## 2020-02-07 NOTE — Telephone Encounter (Signed)
Patient will like for someone to give her a call.  She did not want to state the reason why.

## 2020-02-07 NOTE — Telephone Encounter (Addendum)
Pt has a yeast infection. Pt used some perfumed soap and now has itching. Just started. Please send in med. Thanks!! JSY

## 2020-02-07 NOTE — Progress Notes (Signed)
Will rx diflucan  

## 2020-03-31 ENCOUNTER — Other Ambulatory Visit: Payer: Self-pay

## 2020-03-31 ENCOUNTER — Other Ambulatory Visit (HOSPITAL_COMMUNITY): Payer: Self-pay | Admitting: Physician Assistant

## 2020-03-31 ENCOUNTER — Ambulatory Visit (HOSPITAL_COMMUNITY)
Admission: RE | Admit: 2020-03-31 | Discharge: 2020-03-31 | Disposition: A | Discharge status: Home / Self Care | Payer: 59 | Source: Ambulatory Visit | Attending: Physician Assistant | Admitting: Physician Assistant

## 2020-03-31 DIAGNOSIS — R0602 Shortness of breath: Secondary | ICD-10-CM

## 2020-09-08 ENCOUNTER — Other Ambulatory Visit (HOSPITAL_COMMUNITY): Payer: Self-pay | Admitting: Physician Assistant

## 2020-09-08 DIAGNOSIS — R053 Chronic cough: Secondary | ICD-10-CM

## 2020-09-09 ENCOUNTER — Ambulatory Visit (HOSPITAL_COMMUNITY)
Admission: RE | Admit: 2020-09-09 | Discharge: 2020-09-09 | Disposition: A | Discharge status: Home / Self Care | Payer: 59 | Source: Ambulatory Visit | Attending: Physician Assistant | Admitting: Physician Assistant

## 2020-09-09 ENCOUNTER — Other Ambulatory Visit: Payer: Self-pay

## 2020-09-09 DIAGNOSIS — R053 Chronic cough: Secondary | ICD-10-CM | POA: Diagnosis not present

## 2020-10-01 ENCOUNTER — Ambulatory Visit: Payer: Self-pay | Admitting: Physician Assistant

## 2020-10-01 ENCOUNTER — Encounter: Payer: Self-pay | Admitting: Physician Assistant

## 2020-11-05 ENCOUNTER — Encounter: Payer: Self-pay | Admitting: *Deleted

## 2020-11-06 ENCOUNTER — Ambulatory Visit (INDEPENDENT_AMBULATORY_CARE_PROVIDER_SITE_OTHER): Payer: 59 | Admitting: Cardiology

## 2020-11-06 ENCOUNTER — Other Ambulatory Visit: Payer: Self-pay

## 2020-11-06 ENCOUNTER — Telehealth: Payer: Self-pay | Admitting: Cardiology

## 2020-11-06 ENCOUNTER — Encounter: Payer: Self-pay | Admitting: Cardiology

## 2020-11-06 VITALS — BP 160/80 | HR 93 | Ht <= 58 in | Wt 235.0 lb

## 2020-11-06 DIAGNOSIS — R0902 Hypoxemia: Secondary | ICD-10-CM | POA: Diagnosis not present

## 2020-11-06 DIAGNOSIS — R0602 Shortness of breath: Secondary | ICD-10-CM

## 2020-11-06 DIAGNOSIS — Z136 Encounter for screening for cardiovascular disorders: Secondary | ICD-10-CM

## 2020-11-06 MED ORDER — FUROSEMIDE 20 MG PO TABS: 20.0000 mg | ORAL_TABLET | Freq: Every day | ORAL | 1 refills | Status: DC

## 2020-11-06 NOTE — Patient Instructions (Addendum)
Your physician recommends that you schedule a follow-up appointment in: 1 MONTH   Your physician has recommended you make the following change in your medication:   START LASIX 20 MG DAILY   Your physician recommends that you return for lab work in: 2 WEEKS BMP/BNP/MG AND ABG TOMORROW   Your physician has requested that you have an echocardiogram. Echocardiography is a painless test that uses sound waves to create images of your heart. It provides your doctor with information about the size and shape of your heart and how well your heart's chambers and valves are working. This procedure takes approximately one hour. There are no restrictions for this procedure.  Thank you for choosing [facility]!!

## 2020-11-06 NOTE — Progress Notes (Signed)
Clinical Summary [name] is a 50 y.o.female seen today as a new consult, referred by PA Mann for the following medical problems.   1. SOB/LE edema - Sept 2020 symptoms started - SOB with activities. DOE can vary to some degree. Can DOE with walking a flight of stairs - no chest pains - abdominal swelling, left leg swelling. Weights fairly stable - occasional coughing, wheezing - occasional orthopnea.  - home O2 checks 70% at times she reports - no history of smoking.    - CXR no acute process   2, HTN - home bp's 120-140s/70s - reports bp's often higher in clinic  3. 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 Anemia   . Hernia, inguinal   . Medical history non-contributory   . Ventral hernia 10/02/2015     Allergies  Allergen Reactions  . Other     peaches     No current outpatient medications on file.   No current facility-administered medications for this visit.     Past Surgical History:  Procedure Laterality Date  . ABDOMINAL HYSTERECTOMY    . CESAREAN SECTION     x 2  . ESOPHAGOGASTRODUODENOSCOPY N/A 11/28/2013   Procedure: ESOPHAGOGASTRODUODENOSCOPY (EGD);  Surgeon: Corbin Ade, MD;  Location: [facility];  Service: Endoscopy;  Laterality: N/A;  12:15  . EYE SURGERY    . INCISIONAL HERNIA REPAIR N/A 11/24/2015   Procedure: Sherald Hess HERNIORRHAPHY WITH MESH;  Surgeon: Franky Macho, MD;  Location: [facility];  Service: General;  Laterality: N/A;  . INSERTION OF MESH N/A 11/24/2015   Procedure: INSERTION OF MESH;  Surgeon: Franky Macho, MD;  Location: [facility];  Service: General;  Laterality: N/A;  . TONSILLECTOMY       Allergies  Allergen Reactions  . Other     peaches      Family History  Problem Relation Age of Onset  . Cancer Mother        lung  . Other Father        was shot and killed  . Hyperlipidemia Sister   . Dementia Maternal  Grandmother   . Heart attack Maternal Grandfather   . Colon cancer Neg Hx      Social History [name] reports that she has never smoked. She has never used smokeless tobacco. [name] reports current alcohol use.   Review of Systems CONSTITUTIONAL: No weight loss, fever, chills, weakness or fatigue.  HEENT: Eyes: No visual loss, blurred vision, double vision or yellow sclerae.No hearing loss, sneezing, congestion, runny nose or sore throat.  SKIN: No rash or itching.  CARDIOVASCULAR: per hpi RESPIRATORY: No shortness of breath, cough or sputum.  GASTROINTESTINAL: No anorexia, nausea, vomiting or diarrhea. No abdominal pain or blood.  GENITOURINARY: No burning on urination, no polyuria NEUROLOGICAL: No headache, dizziness, syncope, paralysis, ataxia, numbness or tingling in the extremities. No change in bowel or bladder control.  MUSCULOSKELETAL: No muscle, back pain, joint pain or stiffness.  LYMPHATICS: No enlarged nodes. No history of splenectomy.  PSYCHIATRIC: No history of depression or anxiety.  ENDOCRINOLOGIC: No reports of sweating, cold or heat intolerance. No polyuria or polydipsia.  Marland Kitchen   Physical Examination Today's Vitals   11/06/20 1509  BP: (!) 160/80  Pulse: 93  SpO2: (!) 86%  Weight: 235 lb (106.6 kg)  Height: 4\' 7"  (1.397 m)   Body mass index  is 54.62 kg/m.  Gen: resting comfortably, no acute distress HEENT: no scleral icterus, pupils equal round and reactive, no palptable cervical adenopathy,  CV: RRR, 2/6 sysotlic murmur apex. No jvd Resp: mild crackles bilaterally GI: abdomen is soft, non-tender, non-distended, normal bowel sounds, no hepatosplenomegaly MSK: extremities are warm,trace bilateral edema Skin: warm, no rash Neuro:  no focal deficits Psych: appropriate affect    Assessment and Plan  1. SOB/Hypoxia - O2 sats at home reportedly 70% at times, by O2 monitor here was reportedly high 60%, up to 90% on 2L [place]. She is sitting comfortably  in no distress. The measured O2 sats and clinical picture are not congruent, unclear if for some reason getting inaccurate pulse ox measurements. WIll send for ABG. No signs of cyanosis, breathing comfortable, no clubbing.  - some signs of fluid overload, will start lasix 20mg  daily. Check BMET/Mg/BNP in 1 week - check an echo.      , M.D.

## 2020-11-06 NOTE — Telephone Encounter (Signed)
Patient will also a ABG 11/07/2020 at [facility]

## 2020-11-06 NOTE — Telephone Encounter (Signed)
Checking percert on the following patient for testing scheduled at [facility].   STAT ECHO TOMORROW AT [facility]  DATE:  11/16/2020

## 2020-11-07 ENCOUNTER — Other Ambulatory Visit: Payer: Self-pay

## 2020-11-07 ENCOUNTER — Telehealth: Payer: Self-pay | Admitting: Cardiology

## 2020-11-07 ENCOUNTER — Ambulatory Visit (HOSPITAL_COMMUNITY)
Admission: RE | Admit: 2020-11-07 | Discharge: 2020-11-07 | Disposition: A | Discharge status: Home / Self Care | Payer: 59 | Source: Ambulatory Visit | Attending: Cardiology | Admitting: Cardiology

## 2020-11-07 DIAGNOSIS — I340 Nonrheumatic mitral (valve) insufficiency: Secondary | ICD-10-CM | POA: Diagnosis not present

## 2020-11-07 DIAGNOSIS — R0902 Hypoxemia: Secondary | ICD-10-CM | POA: Insufficient documentation

## 2020-11-07 DIAGNOSIS — R0602 Shortness of breath: Secondary | ICD-10-CM

## 2020-11-07 LAB — BLOOD GAS, ARTERIAL
Acid-Base Excess: 1 mmol/L (ref 0.0–2.0)
Bicarbonate: 25.4 mmol/L (ref 20.0–28.0)
FIO2: 21
O2 Saturation: 92.3 %
Patient temperature: 37
pCO2 arterial: 37.3 mmHg (ref 32.0–48.0)
pH, Arterial: 7.438 (ref 7.350–7.450)
pO2, Arterial: 64.1 mmHg — ABNORMAL LOW (ref 83.0–108.0)

## 2020-11-07 LAB — ECHOCARDIOGRAM COMPLETE
Area-P 1/2: 7.02 cm2
MV VTI: 3.43 cm2
S' Lateral: 4.1 cm

## 2020-11-07 NOTE — Telephone Encounter (Signed)
Pt voiced understanding and will update [place] next week with weights - says O2 at home has been 92% today - scheduled appt 5/12 @ 9am with Dr Wyline Mood

## 2020-11-07 NOTE — Telephone Encounter (Signed)
Echo does show that her heart pumping function is mildly deceased at 40-45% (normal is 50-60%). She also has some evidence of a moderately leaky mitral valve and some signs of pulmonary hypertension. The first step is to get some fluid off with the lasix, we will need to discuss some additional medications at our f/u. Could she call and update [place] on her weights next week.  Her ABG showed her O2 is low but not as low as the pulse oximeter would suggest, hopefully by getting some fluid off her oxygenation will improve   Dina Rich MD

## 2020-11-07 NOTE — Progress Notes (Signed)
  Echocardiogram 2D Echocardiogram has been performed.  Pieter Partridge 11/07/2020, 11:04 AM

## 2020-11-07 NOTE — Telephone Encounter (Signed)
Patient called wants results explained from her Echo done today. She also wants to know what the next step is.

## 2020-11-12 ENCOUNTER — Telehealth: Payer: Self-pay | Admitting: Cardiology

## 2020-11-12 DIAGNOSIS — R0602 Shortness of breath: Secondary | ICD-10-CM

## 2020-11-12 MED ORDER — FUROSEMIDE 20 MG PO TABS: 40.0000 mg | ORAL_TABLET | Freq: Every day | ORAL | 11 refills | Status: DC

## 2020-11-12 NOTE — Telephone Encounter (Signed)
Pt calling with weights.  4/2 228.6lbs 4/3 227.2lbs 4/4 227.9lbs 4/5 227.9lbs 4/6 229.4lbs Pt states that O2 sats have been between 94-98%. Please advise

## 2020-11-12 NOTE — Telephone Encounter (Signed)
Calling to give daily weights   [phone]

## 2020-11-12 NOTE — Telephone Encounter (Signed)
Pt notified and order placed 

## 2020-11-12 NOTE — Telephone Encounter (Signed)
No significant weight loss, can she increase her lasix to 40mg  daily and update again on weight on Monday  Sunday MD

## 2020-11-17 NOTE — Telephone Encounter (Signed)
Still on very low dose lasix, typically start low dose and titrate up until effect. Can she increase lasix to 60mg  daily and udpate again Thursday   J Haward Pope MD

## 2020-11-17 NOTE — Addendum Note (Signed)
Addended by: Burman Nieves T on: 11/17/2020 04:53 PM   Modules accepted: Orders

## 2020-11-17 NOTE — Telephone Encounter (Signed)
Pt voiced understanding

## 2020-11-17 NOTE — Telephone Encounter (Signed)
Pt reporting weight 225.5lbs yesterday 227lbs today (did have heavy hair weaves placed yesterday and says they are heavy) pt says she is not urinating as much as she anticipated with increase of lasix

## 2020-11-17 NOTE — Telephone Encounter (Signed)
Will have labs done on Thursday and call with an update on weight

## 2020-11-20 ENCOUNTER — Other Ambulatory Visit (HOSPITAL_COMMUNITY)
Admission: RE | Admit: 2020-11-20 | Discharge: 2020-11-20 | Disposition: A | Discharge status: Home / Self Care | Payer: 59 | Source: Ambulatory Visit | Attending: Cardiology | Admitting: Cardiology

## 2020-11-20 ENCOUNTER — Other Ambulatory Visit: Payer: Self-pay

## 2020-11-20 DIAGNOSIS — R0602 Shortness of breath: Secondary | ICD-10-CM | POA: Insufficient documentation

## 2020-11-20 DIAGNOSIS — R0902 Hypoxemia: Secondary | ICD-10-CM | POA: Insufficient documentation

## 2020-11-20 LAB — BRAIN NATRIURETIC PEPTIDE: B Natriuretic Peptide: 36 pg/mL (ref 0.0–100.0)

## 2020-11-20 LAB — CBC
HCT: 38.3 % (ref 36.0–46.0)
Hemoglobin: 12.1 g/dL (ref 12.0–15.0)
MCH: 28.9 pg (ref 26.0–34.0)
MCHC: 31.6 g/dL (ref 30.0–36.0)
MCV: 91.6 fL (ref 80.0–100.0)
Platelets: 338 10*3/uL (ref 150–400)
RBC: 4.18 MIL/uL (ref 3.87–5.11)
RDW: 14.8 % (ref 11.5–15.5)
WBC: 9.5 10*3/uL (ref 4.0–10.5)
nRBC: 0 % (ref 0.0–0.2)

## 2020-11-20 LAB — BASIC METABOLIC PANEL
Anion gap: 11 (ref 5–15)
BUN: 16 mg/dL (ref 6–20)
CO2: 25 mmol/L (ref 22–32)
Calcium: 9.2 mg/dL (ref 8.9–10.3)
Chloride: 99 mmol/L (ref 98–111)
Creatinine, Ser: 0.8 mg/dL (ref 0.44–1.00)
GFR, Estimated: >60 mL/min (ref >60)
Glucose, Bld: 116 mg/dL — ABNORMAL HIGH (ref 70–99)
Potassium: 2.9 mmol/L — ABNORMAL LOW (ref 3.5–5.1)
Sodium: 135 mmol/L (ref 135–145)

## 2020-11-20 NOTE — Telephone Encounter (Signed)
Pt f/u with weights over the last few days Tuesday 226.8lb Wednesday 225.7lbs and today 224.8lbs - pt also had labs done [employer])

## 2020-11-21 MED ORDER — POTASSIUM CHLORIDE CRYS ER 20 MEQ PO TBCR: EXTENDED_RELEASE_TABLET | ORAL | 1 refills | Status: DC

## 2020-11-21 NOTE — Telephone Encounter (Signed)
Weight are moving in the right direction, please udpate [place] again on Monday. Potassium is low, take KCl bid x 1 day, then take daily after that. Needs a repeat BMET in 2 weeks.    Dominga Ferry MD

## 2020-11-21 NOTE — Addendum Note (Signed)
Addended by: Burman Nieves T on: 11/21/2020 02:21 PM   Modules accepted: Orders

## 2020-11-21 NOTE — Telephone Encounter (Signed)
Pt voiced understanding and will update [place] on Monday - potassium sent to pharmacy - lab orders placed

## 2020-11-27 ENCOUNTER — Telehealth: Payer: Self-pay | Admitting: *Deleted

## 2020-11-27 NOTE — Telephone Encounter (Signed)
Pt called to report weights:  Saturday 223.9lbs Sunday 225lbs Monday 224.4  Tuesday 226.4lbs Wednesday 225.9lbs Thursday 226.6lbs

## 2020-12-01 NOTE — Telephone Encounter (Signed)
Weights up and down, can she increase lasix to 80mg  daily and update Friday. How is her breathing doing?  Thursday MD

## 2020-12-01 NOTE — Telephone Encounter (Signed)
222.6lbs today and says her breathing is much better - is trying to control salt intake as well - will try to take 80 mg of lasix until Friday and update [place]

## 2020-12-04 ENCOUNTER — Other Ambulatory Visit: Payer: Self-pay | Admitting: *Deleted

## 2020-12-04 ENCOUNTER — Other Ambulatory Visit (HOSPITAL_COMMUNITY)
Admission: RE | Admit: 2020-12-04 | Discharge: 2020-12-04 | Disposition: A | Discharge status: Home / Self Care | Payer: Self-pay | Source: Ambulatory Visit | Attending: Cardiology | Admitting: Cardiology

## 2020-12-04 ENCOUNTER — Other Ambulatory Visit: Payer: Self-pay

## 2020-12-04 DIAGNOSIS — R0602 Shortness of breath: Secondary | ICD-10-CM

## 2020-12-04 LAB — BASIC METABOLIC PANEL
Anion gap: 8 (ref 5–15)
BUN: 15 mg/dL (ref 6–20)
CO2: 27 mmol/L (ref 22–32)
Calcium: 9.5 mg/dL (ref 8.9–10.3)
Chloride: 100 mmol/L (ref 98–111)
Creatinine, Ser: 0.95 mg/dL (ref 0.44–1.00)
GFR, Estimated: >60 mL/min (ref >60)
Glucose, Bld: 99 mg/dL (ref 70–99)
Potassium: 4.1 mmol/L (ref 3.5–5.1)
Sodium: 135 mmol/L (ref 135–145)

## 2020-12-05 ENCOUNTER — Telehealth: Payer: Self-pay | Admitting: Cardiology

## 2020-12-05 NOTE — Telephone Encounter (Signed)
New message   Patient was to report her weight to [facility] Tuesday 222.6  Wednesday 222.6 Thursday 222.8 Friday 222.8

## 2020-12-08 ENCOUNTER — Telehealth: Payer: Self-pay | Admitting: Cardiology

## 2020-12-08 MED ORDER — FUROSEMIDE 20 MG PO TABS: 60.0000 mg | ORAL_TABLET | Freq: Every day | ORAL | 3 refills | Status: DC

## 2020-12-08 NOTE — Telephone Encounter (Signed)
*  STAT* If patient is at the pharmacy, call can be transferred to refill team.   1. Which medications need to be refilled? (please list name of each medication and dose if known)  LASIX  2. Which pharmacy/location (including street and city if local pharmacy) is medication to be sent to? [facility]  3. Do they need a 30 day or 90 day supply?

## 2020-12-08 NOTE — Telephone Encounter (Signed)
Medication sent to pharmacy  

## 2020-12-09 NOTE — Telephone Encounter (Signed)
Pt voiced understanding

## 2020-12-09 NOTE — Telephone Encounter (Signed)
Continue the lasix 80mg  daily until our appt next please  JBranch MD

## 2020-12-18 ENCOUNTER — Ambulatory Visit (INDEPENDENT_AMBULATORY_CARE_PROVIDER_SITE_OTHER): Payer: Self-pay | Admitting: Cardiology

## 2020-12-18 ENCOUNTER — Encounter: Payer: Self-pay | Admitting: Cardiology

## 2020-12-18 ENCOUNTER — Telehealth: Payer: Self-pay | Admitting: Cardiology

## 2020-12-18 VITALS — BP 120/80 | HR 96 | Ht <= 58 in | Wt 225.8 lb

## 2020-12-18 DIAGNOSIS — I272 Pulmonary hypertension, unspecified: Secondary | ICD-10-CM

## 2020-12-18 DIAGNOSIS — I5022 Chronic systolic (congestive) heart failure: Secondary | ICD-10-CM

## 2020-12-18 MED ORDER — METOPROLOL SUCCINATE ER 25 MG PO TB24: 12.5000 mg | ORAL_TABLET | Freq: Every day | ORAL | 1 refills | Status: DC

## 2020-12-18 MED ORDER — ENTRESTO 24-26 MG PO TABS: 1.0000 | ORAL_TABLET | Freq: Two times a day (BID) | ORAL | 3 refills | Status: DC

## 2020-12-18 NOTE — H&P (View-Only) (Signed)
   Clinical Summary [name] is a 50 y.o.female  1. Chronic sysotlic HF - Sept 2020 symptoms started - SOB with activities. DOE can vary to some degree. Can DOE with walking a flight of stairs - no chest pains - abdominal swelling, left leg swelling. Weights fairly stable - occasional coughing, wheezing - occasional orthopnea.  - home O2 checks 70% at times she reports - no history of smoking.    - CXR no acute process - ABG 7.4/37/64/25. O2 sat 92% - 11/2020 echo LVEF 40-45%, normal diastolic fuxn, normal RV function, severe pulm HTN PASP 72  -BNP 36 - we have titrated her lasix to 80mg daily. Swelling and SOB has improved.  - last reported home weight 222. Had been as high as 229    2. 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 Anemia   . Hernia, inguinal   . Medical history non-contributory   . Ventral hernia 10/02/2015     Allergies  Allergen Reactions  . Other     peaches     Current Outpatient Medications  Medication Sig Dispense Refill  . furosemide (LASIX) 20 MG tablet Take 3 tablets (60 mg total) by mouth daily. 90 tablet 3  . potassium chloride SA (KLOR-CON) 20 MEQ tablet TAKE 40 MEQ TWICE DAILY ON DAY 1 THEN TAKE 40 MEQ DAILY 90 tablet 1   No current facility-administered medications for this visit.     Past Surgical History:  Procedure Laterality Date  . ABDOMINAL HYSTERECTOMY    . CESAREAN SECTION     x 2  . ESOPHAGOGASTRODUODENOSCOPY N/A 11/28/2013   Procedure: ESOPHAGOGASTRODUODENOSCOPY (EGD);  Surgeon: Robert M Rourk, MD;  Location: [facility];  Service: Endoscopy;  Laterality: N/A;  12:15  . EYE SURGERY    . INCISIONAL HERNIA REPAIR N/A 11/24/2015   Procedure: INCISIONAL HERNIORRHAPHY WITH MESH;  Surgeon: Mark Jenkins, MD;  Location: [facility];  Service: General;  Laterality: N/A;  . INSERTION OF MESH N/A 11/24/2015   Procedure: INSERTION OF  MESH;  Surgeon: Mark Jenkins, MD;  Location: [facility];  Service: General;  Laterality: N/A;  . TONSILLECTOMY       Allergies  Allergen Reactions  . Other     peaches      Family History  Problem Relation Age of Onset  . Cancer Mother        lung  . Other Father        was shot and killed  . Hyperlipidemia Sister   . Dementia Maternal Grandmother   . Heart attack Maternal Grandfather   . Colon cancer Neg Hx      Social History Ms. Siebenaler reports that she has never smoked. She has never used smokeless tobacco. Ms. Wigley reports current alcohol use.   Review of Systems CONSTITUTIONAL: No weight loss, fever, chills, weakness or fatigue.  HEENT: Eyes: No visual loss, blurred vision, double vision or yellow sclerae.No hearing loss, sneezing, congestion, runny nose or sore throat.  SKIN: No rash or itching.  CARDIOVASCULAR: per hpi RESPIRATORY: No shortness of breath, cough or sputum.  GASTROINTESTINAL: No anorexia, nausea, vomiting or diarrhea. No abdominal pain or blood.  GENITOURINARY: No burning on urination, no polyuria NEUROLOGICAL: No headache, dizziness, syncope, paralysis, ataxia, numbness or tingling in the extremities. No change in bowel or bladder control.  MUSCULOSKELETAL: No muscle, back pain, joint pain or stiffness.    LYMPHATICS: No enlarged nodes. No history of splenectomy.  PSYCHIATRIC: No history of depression or anxiety.  ENDOCRINOLOGIC: No reports of sweating, cold or heat intolerance. No polyuria or polydipsia.  Marland Kitchen   Physical Examination Today's Vitals   12/18/20 0910  BP: 120/80  Pulse: 96  SpO2: 98%  Weight: 225 lb 12.8 oz (102.4 kg)  Height: 4\' 7"  (1.397 m)   Body mass index is 52.48 kg/m.  Gen: resting comfortably, no acute distress HEENT: no scleral icterus, pupils equal round and reactive, no palptable cervical adenopathy,  CV: RRR, no m/r/g no jvd Resp: Clear to auscultation bilaterally GI: abdomen is soft, non-tender, non-distended,  normal bowel sounds, no hepatosplenomegaly MSK: extremities are warm, no edema.  Skin: warm, no rash Neuro:  no focal deficits Psych: appropriate affect   Diagnostic Studies 11/2020 echo IMPRESSIONS    1. Left ventricular ejection fraction, by estimation, is 40 to 45%. The  left ventricle has mildly decreased function. The left ventricle  demonstrates global hypokinesis with paradoxical septal motion suggestive  of LBBB. There is mild left ventricular  hypertrophy. Left ventricular diastolic parameters were normal.  2. Right ventricular systolic function is normal. The right ventricular  size is normal. There is severely elevated pulmonary artery systolic  pressure. The estimated right ventricular systolic pressure is 71.6 mmHg  (TR Doppler jet might be contaminated  by LVOT flow).  3. Left atrial size was moderately dilated.  4. The mitral valve is grossly normal. Moderate mitral valve  regurgitation, posteriorly directed.  5. The aortic valve is tricuspid. Aortic valve regurgitation is not  visualized.  6. The inferior vena cava is normal in size with greater than 50%  respiratory variability, suggesting right atrial pressure of 3 mmHg.     Assessment and Plan   1. Chronic systolic HF - new diagnosis based on 11/2020 echo, LVEF 40-45% -down about 10 lbs on lasix,continue 80mg  daily. 222 lbs on her home scale appears to be a good weight for her - start toprol 12.5mg , entresto 24/26mg  bid. CHeck bmet 2 weeks - in setting of systolic HF and severe pulmonary HTN by echo we discussed RHC/LHC. She would like to wait until after June 2nd for insurance purposes  2. Pulmonary HTN - severe by echo, normal RV function - f/u RHC, pending results and hemodyanmics may need further workup with PFTs, OSA eval, VQ, autoimmune panel.  - of note rather marked hypoxia at our initial visit resolved today   3. Chronic LBBB  F/u 1 month  I have reviewed the risks, indications, and  alternatives to cardiac catheterization, possible angioplasty, and stenting with the patient  today. Risks include but are not limited to bleeding, infection, vascular injury, stroke, myocardial infection, arrhythmia, kidney injury, radiation-related injury in the case of prolonged fluoroscopy use, emergency cardiac surgery, and death. The patient understands the risks of serious complication is 1-2 in 1000 with diagnostic cardiac cath and 1-2% or less with angioplasty/stenting.   , M.D.

## 2020-12-18 NOTE — Progress Notes (Signed)
Clinical Summary [name] is a 50 y.o.female  1. Chronic sysotlic HF - Sept 2020 symptoms started - SOB with activities. DOE can vary to some degree. Can DOE with walking a flight of stairs - no chest pains - abdominal swelling, left leg swelling. Weights fairly stable - occasional coughing, wheezing - occasional orthopnea.  - home O2 checks 70% at times she reports - no history of smoking.    - CXR no acute process - ABG 7.4/37/64/25. O2 sat 92% - 11/2020 echo LVEF 40-45%, normal diastolic fuxn, normal RV function, severe pulm HTN PASP 72  -BNP 36 - we have titrated her lasix to 80mg  daily. Swelling and SOB has improved.  - last reported home weight 222. Had been as high as 229    2. 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 Anemia   . Hernia, inguinal   . Medical history non-contributory   . Ventral hernia 10/02/2015     Allergies  Allergen Reactions  . Other     peaches     Current Outpatient Medications  Medication Sig Dispense Refill  . furosemide (LASIX) 20 MG tablet Take 3 tablets (60 mg total) by mouth daily. 90 tablet 3  . potassium chloride SA (KLOR-CON) 20 MEQ tablet TAKE 40 MEQ TWICE DAILY ON DAY 1 THEN TAKE 40 MEQ DAILY 90 tablet 1   No current facility-administered medications for this visit.     Past Surgical History:  Procedure Laterality Date  . ABDOMINAL HYSTERECTOMY    . CESAREAN SECTION     x 2  . ESOPHAGOGASTRODUODENOSCOPY N/A 11/28/2013   Procedure: ESOPHAGOGASTRODUODENOSCOPY (EGD);  Surgeon: 11/30/2013, MD;  Location: [facility];  Service: Endoscopy;  Laterality: N/A;  12:15  . EYE SURGERY    . INCISIONAL HERNIA REPAIR N/A 11/24/2015   Procedure: 11/26/2015 HERNIORRHAPHY WITH MESH;  Surgeon: Sherald Hess, MD;  Location: [facility];  Service: General;  Laterality: N/A;  . INSERTION OF MESH N/A 11/24/2015   Procedure: INSERTION OF  MESH;  Surgeon: 11/26/2015, MD;  Location: [facility];  Service: General;  Laterality: N/A;  . TONSILLECTOMY       Allergies  Allergen Reactions  . Other     peaches      Family History  Problem Relation Age of Onset  . Cancer Mother        lung  . Other Father        was shot and killed  . Hyperlipidemia Sister   . Dementia Maternal Grandmother   . Heart attack Maternal Grandfather   . Colon cancer Neg Hx      Social History Ms. Mcmasters reports that she has never smoked. She has never used smokeless tobacco. Ms. Mast reports current alcohol use.   Review of Systems CONSTITUTIONAL: No weight loss, fever, chills, weakness or fatigue.  HEENT: Eyes: No visual loss, blurred vision, double vision or yellow sclerae.No hearing loss, sneezing, congestion, runny nose or sore throat.  SKIN: No rash or itching.  CARDIOVASCULAR: per hpi RESPIRATORY: No shortness of breath, cough or sputum.  GASTROINTESTINAL: No anorexia, nausea, vomiting or diarrhea. No abdominal pain or blood.  GENITOURINARY: No burning on urination, no polyuria NEUROLOGICAL: No headache, dizziness, syncope, paralysis, ataxia, numbness or tingling in the extremities. No change in bowel or bladder control.  MUSCULOSKELETAL: No muscle, back pain, joint pain or stiffness.  LYMPHATICS: No enlarged nodes. No history of splenectomy.  PSYCHIATRIC: No history of depression or anxiety.  ENDOCRINOLOGIC: No reports of sweating, cold or heat intolerance. No polyuria or polydipsia.  Marland Kitchen   Physical Examination Today's Vitals   12/18/20 0910  BP: 120/80  Pulse: 96  SpO2: 98%  Weight: 225 lb 12.8 oz (102.4 kg)  Height: 4\' 7"  (1.397 m)   Body mass index is 52.48 kg/m.  Gen: resting comfortably, no acute distress HEENT: no scleral icterus, pupils equal round and reactive, no palptable cervical adenopathy,  CV: RRR, no m/r/g no jvd Resp: Clear to auscultation bilaterally GI: abdomen is soft, non-tender, non-distended,  normal bowel sounds, no hepatosplenomegaly MSK: extremities are warm, no edema.  Skin: warm, no rash Neuro:  no focal deficits Psych: appropriate affect   Diagnostic Studies 11/2020 echo IMPRESSIONS    1. Left ventricular ejection fraction, by estimation, is 40 to 45%. The  left ventricle has mildly decreased function. The left ventricle  demonstrates global hypokinesis with paradoxical septal motion suggestive  of LBBB. There is mild left ventricular  hypertrophy. Left ventricular diastolic parameters were normal.  2. Right ventricular systolic function is normal. The right ventricular  size is normal. There is severely elevated pulmonary artery systolic  pressure. The estimated right ventricular systolic pressure is 71.6 mmHg  (TR Doppler jet might be contaminated  by LVOT flow).  3. Left atrial size was moderately dilated.  4. The mitral valve is grossly normal. Moderate mitral valve  regurgitation, posteriorly directed.  5. The aortic valve is tricuspid. Aortic valve regurgitation is not  visualized.  6. The inferior vena cava is normal in size with greater than 50%  respiratory variability, suggesting right atrial pressure of 3 mmHg.     Assessment and Plan   1. Chronic systolic HF - new diagnosis based on 11/2020 echo, LVEF 40-45% -down about 10 lbs on lasix,continue 80mg  daily. 222 lbs on her home scale appears to be a good weight for her - start toprol 12.5mg , entresto 24/26mg  bid. CHeck bmet 2 weeks - in setting of systolic HF and severe pulmonary HTN by echo we discussed RHC/LHC. She would like to wait until after June 2nd for insurance purposes  2. Pulmonary HTN - severe by echo, normal RV function - f/u RHC, pending results and hemodyanmics may need further workup with PFTs, OSA eval, VQ, autoimmune panel.  - of note rather marked hypoxia at our initial visit resolved today   3. Chronic LBBB  F/u 1 month  I have reviewed the risks, indications, and  alternatives to cardiac catheterization, possible angioplasty, and stenting with the patient  today. Risks include but are not limited to bleeding, infection, vascular injury, stroke, myocardial infection, arrhythmia, kidney injury, radiation-related injury in the case of prolonged fluoroscopy use, emergency cardiac surgery, and death. The patient understands the risks of serious complication is 1-2 in 1000 with diagnostic cardiac cath and 1-2% or less with angioplasty/stenting.   , M.D.

## 2020-12-18 NOTE — Telephone Encounter (Signed)
L/R [facility] 6/7 @7 :30AM VARANASI    Patient is self pay. Is applying for assistance thru [facility].

## 2020-12-18 NOTE — Patient Instructions (Signed)
Your physician recommends that you schedule a follow-up appointment in: 1 MONTH WITH DR [facility]  Your physician has recommended you make the following change in your medication:   START TOPROL XL 12.5 MG DAILY   START ENTRESTO 24/26 MG TWICE DAILY   Your physician recommends that you return for lab work 2 WEEKS - BEFORE YOUR PROCEDURE      [facility] CARDIOVASCULAR DIVISION [facility] [address] [place] [address] Dept: [phone] Loc: [phone]  MAYSON STERBENZ  12/18/2020  You are scheduled for a Cardiac Catheterization on Tuesday, June 7 with Dr. Lance Muss.  1. Please arrive at the [facility] (Main Entrance A) at [facility]: [address] [place], [place] [address] at 5:30 AM (This time is two hours before your procedure to ensure your preparation). Free valet parking service is available.   Special note: Every effort is made to have your procedure done on time. Please understand that emergencies sometimes delay scheduled procedures.  2. Diet: Do not eat solid foods after midnight.  The patient may have clear liquids until 5am upon the day of the procedure.  3. Labs: You will need to have blood drawn on Friday June 3RD   4. Medication instructions in preparation for your procedure:   Contrast Allergy: No    On the morning of your procedure any morning medicines NOT listed above.  You may use sips of water.  5. Plan for one night stay--bring personal belongings. 6. Bring a current list of your medications and current insurance cards. 7. You MUST have a responsible person to drive you home. 8. Someone MUST be with you the first 24 hours after you arrive home or your discharge will be delayed. 9. Please wear clothes that are easy to get on and off and wear slip-on shoes.  Thank you for allowing [place] to care for you!   -- [facility]

## 2021-01-08 ENCOUNTER — Telehealth: Payer: Self-pay

## 2021-01-08 NOTE — Telephone Encounter (Signed)
Pt contacted pre-catheterization scheduled at [facility] for: 01/13/21 Verified arrival time and place: [facility] Main Entrance A [facility]) at: 5:30 am.    No solid food after midnight prior to cath, clear liquids until 5 AM day of procedure.  CONTRAST ALLERGY: NO  AM meds can be  taken pre-cath with sips of water including: ASA 81 mg  Will Hold her Lasix morning of 01/13/21.   Confirmed patient has responsible adult to drive home post procedure and be with patient first 24 hours after arriving home: God-sister.   You are allowed ONE visitor in the waiting room during the time you are at the hospital for your procedure. Both you and your visitor must wear a mask once you enter the hospital.   Patient reports does not currently have any symptoms concerning for COVID-19 and no household members with COVID-19 like illness.   Pt to have her labs drawn 01/09/21.  She also reports that her insurance has started today after switching employment and will bring her card and information with her. She says she has already given the information to [facility] billing. Will send a message to precert for follow up.

## 2021-01-09 ENCOUNTER — Other Ambulatory Visit (HOSPITAL_COMMUNITY): Admission: RE | Admit: 2021-01-09 | Payer: Self-pay | Source: Ambulatory Visit

## 2021-01-12 ENCOUNTER — Other Ambulatory Visit: Payer: Self-pay

## 2021-01-12 ENCOUNTER — Other Ambulatory Visit (HOSPITAL_COMMUNITY)
Admission: RE | Admit: 2021-01-12 | Discharge: 2021-01-12 | Disposition: A | Discharge status: Home / Self Care | Payer: 59 | Source: Ambulatory Visit | Attending: Cardiology | Admitting: Cardiology

## 2021-01-12 DIAGNOSIS — I5022 Chronic systolic (congestive) heart failure: Secondary | ICD-10-CM | POA: Insufficient documentation

## 2021-01-12 LAB — CBC
HCT: 37.7 % (ref 36.0–46.0)
Hemoglobin: 12 g/dL (ref 12.0–15.0)
MCH: 29.2 pg (ref 26.0–34.0)
MCHC: 31.8 g/dL (ref 30.0–36.0)
MCV: 91.7 fL (ref 80.0–100.0)
Platelets: 313 10*3/uL (ref 150–400)
RBC: 4.11 MIL/uL (ref 3.87–5.11)
RDW: 14.6 % (ref 11.5–15.5)
WBC: 10.5 10*3/uL (ref 4.0–10.5)
nRBC: 0 % (ref 0.0–0.2)

## 2021-01-12 LAB — BASIC METABOLIC PANEL
Anion gap: 7 (ref 5–15)
BUN: 11 mg/dL (ref 6–20)
CO2: 28 mmol/L (ref 22–32)
Calcium: 8.8 mg/dL — ABNORMAL LOW (ref 8.9–10.3)
Chloride: 102 mmol/L (ref 98–111)
Creatinine, Ser: 0.84 mg/dL (ref 0.44–1.00)
GFR, Estimated: >60 mL/min (ref >60)
Glucose, Bld: 108 mg/dL — ABNORMAL HIGH (ref 70–99)
Potassium: 4 mmol/L (ref 3.5–5.1)
Sodium: 137 mmol/L (ref 135–145)

## 2021-01-12 LAB — MAGNESIUM: Magnesium: 2.1 mg/dL (ref 1.7–2.4)

## 2021-01-13 ENCOUNTER — Encounter (HOSPITAL_COMMUNITY)
Admission: RE | Disposition: A | Discharge status: Home / Self Care | Payer: Self-pay | Source: Ambulatory Visit | Attending: Interventional Cardiology

## 2021-01-13 ENCOUNTER — Ambulatory Visit (HOSPITAL_COMMUNITY)
Admission: RE | Admit: 2021-01-13 | Discharge: 2021-01-13 | Disposition: A | Discharge status: Home / Self Care | Payer: 59 | Source: Ambulatory Visit | Attending: Interventional Cardiology | Admitting: Interventional Cardiology

## 2021-01-13 ENCOUNTER — Other Ambulatory Visit: Payer: Self-pay

## 2021-01-13 ENCOUNTER — Encounter (HOSPITAL_COMMUNITY): Payer: Self-pay | Admitting: Interventional Cardiology

## 2021-01-13 DIAGNOSIS — Z79899 Other long term (current) drug therapy: Secondary | ICD-10-CM | POA: Diagnosis not present

## 2021-01-13 DIAGNOSIS — I447 Left bundle-branch block, unspecified: Secondary | ICD-10-CM | POA: Insufficient documentation

## 2021-01-13 DIAGNOSIS — I428 Other cardiomyopathies: Secondary | ICD-10-CM | POA: Insufficient documentation

## 2021-01-13 DIAGNOSIS — I5022 Chronic systolic (congestive) heart failure: Secondary | ICD-10-CM

## 2021-01-13 DIAGNOSIS — Z90710 Acquired absence of both cervix and uterus: Secondary | ICD-10-CM | POA: Diagnosis not present

## 2021-01-13 DIAGNOSIS — Z8249 Family history of ischemic heart disease and other diseases of the circulatory system: Secondary | ICD-10-CM | POA: Insufficient documentation

## 2021-01-13 DIAGNOSIS — I272 Pulmonary hypertension, unspecified: Secondary | ICD-10-CM | POA: Insufficient documentation

## 2021-01-13 DIAGNOSIS — I110 Hypertensive heart disease with heart failure: Secondary | ICD-10-CM | POA: Insufficient documentation

## 2021-01-13 DIAGNOSIS — Z98891 History of uterine scar from previous surgery: Secondary | ICD-10-CM | POA: Diagnosis not present

## 2021-01-13 DIAGNOSIS — Z8719 Personal history of other diseases of the digestive system: Secondary | ICD-10-CM | POA: Insufficient documentation

## 2021-01-13 HISTORY — PX: RIGHT/LEFT HEART CATH AND CORONARY ANGIOGRAPHY: CATH118266

## 2021-01-13 LAB — POCT I-STAT EG7
Acid-Base Excess: 0 mmol/L (ref 0.0–2.0)
Acid-Base Excess: 0 mmol/L (ref 0.0–2.0)
Bicarbonate: 25.3 mmol/L (ref 20.0–28.0)
Bicarbonate: 25.3 mmol/L (ref 20.0–28.0)
Calcium, Ion: 1.24 mmol/L (ref 1.15–1.40)
Calcium, Ion: 1.23 mmol/L (ref 1.15–1.40)
HCT: 33 % — ABNORMAL LOW (ref 36.0–46.0)
HCT: 34 % — ABNORMAL LOW (ref 36.0–46.0)
Hemoglobin: 11.2 g/dL — ABNORMAL LOW (ref 12.0–15.0)
Hemoglobin: 11.6 g/dL — ABNORMAL LOW (ref 12.0–15.0)
O2 Saturation: 82 %
O2 Saturation: 81 %
Potassium: 4.1 mmol/L (ref 3.5–5.1)
Potassium: 4.1 mmol/L (ref 3.5–5.1)
Sodium: 140 mmol/L (ref 135–145)
Sodium: 140 mmol/L (ref 135–145)
TCO2: 27 mmol/L (ref 22–32)
TCO2: 27 mmol/L (ref 22–32)
pCO2, Ven: 43.2 mmHg — ABNORMAL LOW (ref 44.0–60.0)
pCO2, Ven: 42.5 mmHg — ABNORMAL LOW (ref 44.0–60.0)
pH, Ven: 7.376 (ref 7.250–7.430)
pH, Ven: 7.383 (ref 7.250–7.430)
pO2, Ven: 47 mmHg — ABNORMAL HIGH (ref 32.0–45.0)
pO2, Ven: 46 mmHg — ABNORMAL HIGH (ref 32.0–45.0)

## 2021-01-13 LAB — POCT I-STAT 7, (LYTES, BLD GAS, ICA,H+H)
Acid-Base Excess: 0 mmol/L (ref 0.0–2.0)
Bicarbonate: 25 mmol/L (ref 20.0–28.0)
Calcium, Ion: 1.23 mmol/L (ref 1.15–1.40)
HCT: 33 % — ABNORMAL LOW (ref 36.0–46.0)
Hemoglobin: 11.2 g/dL — ABNORMAL LOW (ref 12.0–15.0)
O2 Saturation: 100 %
Potassium: 4 mmol/L (ref 3.5–5.1)
Sodium: 140 mmol/L (ref 135–145)
TCO2: 26 mmol/L (ref 22–32)
pCO2 arterial: 38.9 mmHg (ref 32.0–48.0)
pH, Arterial: 7.416 (ref 7.350–7.450)
pO2, Arterial: 194 mmHg — ABNORMAL HIGH (ref 83.0–108.0)

## 2021-01-13 SURGERY — RIGHT/LEFT HEART CATH AND CORONARY ANGIOGRAPHY
Anesthesia: LOCAL

## 2021-01-13 MED ORDER — SODIUM CHLORIDE 0.9 % IV SOLN: INTRAVENOUS | Status: DC

## 2021-01-13 MED ORDER — LABETALOL HCL 5 MG/ML IV SOLN: 10.0000 mg | INTRAVENOUS | Status: DC | PRN

## 2021-01-13 MED ORDER — ASPIRIN 81 MG PO CHEW: 81.0000 mg | CHEWABLE_TABLET | ORAL | Status: DC

## 2021-01-13 MED ORDER — VERAPAMIL HCL 2.5 MG/ML IV SOLN
INTRAVENOUS | Status: DC | PRN
  Administered 2021-01-13: 10 mL via INTRA_ARTERIAL

## 2021-01-13 MED ORDER — HEPARIN (PORCINE) IN NACL 1000-0.9 UT/500ML-% IV SOLN
INTRAVENOUS | Status: DC | PRN
  Administered 2021-01-13 (×2): 500 mL

## 2021-01-13 MED ORDER — LIDOCAINE HCL (PF) 1 % IJ SOLN
INTRAMUSCULAR | Status: DC | PRN
  Administered 2021-01-13 (×2): 2 mL

## 2021-01-13 MED ORDER — SODIUM CHLORIDE 0.9 % IV SOLN: 250.0000 mL | INTRAVENOUS | Status: DC | PRN

## 2021-01-13 MED ORDER — LIDOCAINE HCL (PF) 1 % IJ SOLN
INTRAMUSCULAR | Status: AC
  Filled 2021-01-13: qty 30

## 2021-01-13 MED ORDER — SODIUM CHLORIDE 0.9% FLUSH: 3.0000 mL | INTRAVENOUS | Status: DC | PRN

## 2021-01-13 MED ORDER — MIDAZOLAM HCL 2 MG/2ML IJ SOLN
INTRAMUSCULAR | Status: DC | PRN
  Administered 2021-01-13: 2 mg via INTRAVENOUS

## 2021-01-13 MED ORDER — IOHEXOL 350 MG/ML SOLN
INTRAVENOUS | Status: DC | PRN
  Administered 2021-01-13: 50 mL

## 2021-01-13 MED ORDER — VERAPAMIL HCL 2.5 MG/ML IV SOLN
INTRAVENOUS | Status: AC
  Filled 2021-01-13: qty 2

## 2021-01-13 MED ORDER — HEPARIN SODIUM (PORCINE) 1000 UNIT/ML IJ SOLN
INTRAMUSCULAR | Status: AC
  Filled 2021-01-13: qty 1

## 2021-01-13 MED ORDER — SODIUM CHLORIDE 0.9% FLUSH: 3.0000 mL | Freq: Two times a day (BID) | INTRAVENOUS | Status: DC

## 2021-01-13 MED ORDER — MIDAZOLAM HCL 2 MG/2ML IJ SOLN
INTRAMUSCULAR | Status: AC
  Filled 2021-01-13: qty 2

## 2021-01-13 MED ORDER — HEPARIN SODIUM (PORCINE) 1000 UNIT/ML IJ SOLN
INTRAMUSCULAR | Status: DC | PRN
  Administered 2021-01-13: 5000 [IU] via INTRAVENOUS

## 2021-01-13 MED ORDER — ACETAMINOPHEN 325 MG PO TABS: 650.0000 mg | ORAL_TABLET | ORAL | Status: DC | PRN

## 2021-01-13 MED ORDER — HYDRALAZINE HCL 20 MG/ML IJ SOLN: 10.0000 mg | INTRAMUSCULAR | Status: DC | PRN

## 2021-01-13 MED ORDER — ONDANSETRON HCL 4 MG/2ML IJ SOLN: 4.0000 mg | Freq: Four times a day (QID) | INTRAMUSCULAR | Status: DC | PRN

## 2021-01-13 MED ORDER — HEPARIN (PORCINE) IN NACL 1000-0.9 UT/500ML-% IV SOLN
INTRAVENOUS | Status: AC
  Filled 2021-01-13: qty 1000

## 2021-01-13 SURGICAL SUPPLY — 11 items

## 2021-01-13 NOTE — Progress Notes (Signed)
Ambulated in hallway tol well ambulated to the bathroom to void

## 2021-01-13 NOTE — Interval H&P Note (Signed)
Cath Lab Visit (complete for each Cath Lab visit)  Clinical Evaluation Leading to the Procedure:   ACS: No.  Non-ACS:    Anginal Classification: CCS III  Anti-ischemic medical therapy: Minimal Therapy (1 class of medications)  Non-Invasive Test Results: Intermediate-risk stress test findings: cardiac mortality 1-3%/year  Prior CABG: No previous CABG  Significant pulm HTN noted by echo    History and Physical Interval Note:  01/13/2021 7:43 AM  [name]  has presented today for surgery, with the diagnosis of chronic systolic heart failure.  The various methods of treatment have been discussed with the patient and family. After consideration of risks, benefits and other options for treatment, the patient has consented to  Procedure(s): RIGHT/LEFT HEART CATH AND CORONARY ANGIOGRAPHY (N/A) as a surgical intervention.  The patient's history has been reviewed, patient examined, no change in status, stable for surgery.  I have reviewed the patient's chart and labs.  Questions were answered to the patient's satisfaction.     Lance Muss

## 2021-01-13 NOTE — Research (Signed)
Identify Informed Consent   Subject Name: [name]  Subject met inclusion and exclusion criteria.  The informed consent form, study requirements and expectations were reviewed with the subject and questions and concerns were addressed prior to the signing of the consent form.  The subject verbalized understanding of the trail requirements.  The subject agreed to participate in the Identify trial and signed the informed consent.  The informed consent was obtained prior to performance of any protocol-specific procedures for the subject.  A copy of the signed informed consent was given to the subject and a copy was placed in the subject's medical record.  Philemon Kingdom D 01/13/2021, 0701am

## 2021-01-13 NOTE — Progress Notes (Signed)
Discharge instructions reviewed with pt and her sister both voice understanding.  

## 2021-01-13 NOTE — Discharge Instructions (Signed)
Radial Site Care  This sheet gives you information about how to care for yourself after your procedure. Your health care provider may also give you more specific instructions. If you have problems or questions, contact your health care provider. What can I expect after the procedure? After the procedure, it is common to have:  Bruising and tenderness at the catheter insertion area. Follow these instructions at home: Medicines  Take over-the-counter and prescription medicines only as told by your health care provider. Insertion site care 1. Follow instructions from your health care provider about how to take care of your insertion site. Make sure you: ? Wash your hands with soap and water before you remove your bandage (dressing). If soap and water are not available, use hand sanitizer. ? May remove dressing in 24 hours. 2. Check your insertion site every day for signs of infection. Check for: ? Redness, swelling, or pain. ? Fluid or blood. ? Pus or a bad smell. ? Warmth. 3. Do no take baths, swim, or use a hot tub for 5 days. 4. You may shower 24-48 hours after the procedure. ? Remove the dressing and gently wash the site with plain soap and water. ? Pat the area dry with a clean towel. ? Do not rub the site. That could cause bleeding. 5. Do not apply powder or lotion to the site. Activity  1. For 24 hours after the procedure, or as directed by your health care provider: ? Do not flex or bend the affected arm. ? Do not push or pull heavy objects with the affected arm. ? Do not drive yourself home from the hospital or clinic. You may drive 24 hours after the procedure. ? Do not operate machinery or power tools. ? KEEP ARM ELEVATED THE REMAINDER OF THE DAY. 2. Do not push, pull or lift anything that is heavier than 10 lb for 5 days. 3. Ask your health care provider when it is okay to: ? Return to work or school. ? Resume usual physical activities or sports. ? Resume sexual  activity. General instructions  If the catheter site starts to bleed, raise your arm and put firm pressure on the site. If the bleeding does not stop, get help right away. This is a medical emergency.  DRINK PLENTY OF FLUIDS FOR THE NEXT 2-3 DAYS.  No alcohol consumption for 24 hours after receiving sedation.  If you went home on the same day as your procedure, a responsible adult should be with you for the first 24 hours after you arrive home.  Keep all follow-up visits as told by your health care provider. This is important. Contact a health care provider if:  You have a fever.  You have redness, swelling, or yellow drainage around your insertion site. Get help right away if:  You have unusual pain at the radial site.  The catheter insertion area swells very fast.  The insertion area is bleeding, and the bleeding does not stop when you hold steady pressure on the area.  Your arm or hand becomes pale, cool, tingly, or numb. These symptoms may represent a serious problem that is an emergency. Do not wait to see if the symptoms will go away. Get medical help right away. Call your local emergency services (911 in the [place] Do not drive yourself to the hospital. Summary  After the procedure, it is common to have bruising and tenderness at the site.  Follow instructions from your health care provider about how to take care   of your radial site wound. Check the wound every day for signs of infection.  This information is not intended to replace advice given to you by your health care provider. Make sure you discuss any questions you have with your health care provider. Document Revised: 08/31/2017 Document Reviewed: 08/31/2017 Elsevier Patient Education  2020 [employer] 

## 2021-01-14 NOTE — Progress Notes (Signed)
Cardiology Office Note  Date: 01/15/2021   ID: [name], DOB Sep 16, 1970, MRN [id number]  PCP:  Garnette Gunner, MD  Cardiologist:  Dina Rich, MD Electrophysiologist:  None   Chief Complaint: 1 month post cardiac catheterization  History of Present Illness: [name] is a 50 y.o. female with a history of chronic systolic HF, morbid obesity, anemia, Chronic LBBB.   Last seen by Dr Wyline Mood on 12/18/2020. She had been experiencing SOB / DOE, abdominal swelling, leg swelling, occasional orthopnea. Home 02 checks decreased to 70 % at times. Echocardiogram 11/2020 EF 40-45% normal diastolic function, normal RV function, severe pulmonary HTN 72 mmHg. Her Lasix had been increased to 80 mg po daily. Toprol was started at 12.5 mg po bid. Entresto started at 24/26 mg po bid. Cardiac catheterization was scheduled. Dr Wyline Mood noted she may need PFT's OSA evaluation, VQ, autoimmune panel.  She is here for follow-up today.  Denies any anginal or exertional symptoms.  States that shortness of breath has significantly improved since starting Lasix and Entresto.  She states her weight is staying stable.  She states this morning at home she weighed 222 pounds versus our scales at 226 today.  Denies any lower extremity edema, orthostatic symptoms CVA or TIA-like symptoms, palpitations or arrhythmias, PND, orthopnea.  Denies any bleeding, claudication, DVT or PE-like symptoms. We discussed going to heart failure clinic for evaluation.  We discussed seeing pulmonology for PFTs and sleep evaluation.  She denies any cigarette smoking but has had secondhand exposure in the past.  Previous history of marijuana use but this was quite a few years ago.     Past Medical History:  Diagnosis Date   Anemia    Hernia, inguinal    Medical history non-contributory    Ventral hernia 10/02/2015    Past Surgical History:  Procedure Laterality Date   ABDOMINAL HYSTERECTOMY     CESAREAN SECTION     x 2    ESOPHAGOGASTRODUODENOSCOPY N/A 11/28/2013   Procedure: ESOPHAGOGASTRODUODENOSCOPY (EGD);  Surgeon: Corbin Ade, MD;  [facility];  Service: Endoscopy;  Laterality: N/A;  12:15   EYE SURGERY     INCISIONAL HERNIA REPAIR N/A 11/24/2015   Procedure: Sherald Hess HERNIORRHAPHY WITH MESH;  Surgeon: Franky Macho, MD;  Location: [facility];  Service: General;  Laterality: N/A;   INSERTION OF MESH N/A 11/24/2015   Procedure: INSERTION OF MESH;  Surgeon: Franky Macho, MD;  Location: [facility];  Service: General;  Laterality: N/A;   RIGHT/LEFT HEART CATH AND CORONARY ANGIOGRAPHY N/A 01/13/2021   Procedure: RIGHT/LEFT HEART CATH AND CORONARY ANGIOGRAPHY;  Surgeon: Corky Crafts, MD;  Location: [facility] INVASIVE CV LAB;  Service: Cardiovascular;  Laterality: N/A;   TONSILLECTOMY      Current Outpatient Medications  Medication Sig Dispense Refill   Biotin w/ Vitamins C & E (HAIR SKIN & NAILS GUMMIES PO) Take 2 each by mouth daily.     furosemide (LASIX) 80 MG tablet Take 80 mg by mouth daily.     levocetirizine (XYZAL) 5 MG tablet Take 5 mg by mouth every evening.     metoprolol succinate (TOPROL XL) 25 MG 24 hr tablet Take 0.5 tablets (12.5 mg total) by mouth daily. 45 tablet 1   potassium chloride SA (KLOR-CON) 20 MEQ tablet TAKE 40 MEQ TWICE DAILY ON DAY 1 THEN TAKE 40 MEQ DAILY (Patient taking differently: Take 40 mEq by mouth daily.) 90 tablet 1   sacubitril-valsartan (ENTRESTO) 24-26 MG Take  1 tablet by mouth 2 (two) times daily. 60 tablet 3   No current facility-administered medications for this visit.   Allergies:  Other   Social History: The patient  reports that she has never smoked. She has never used smokeless tobacco. She reports current alcohol use. She reports that she does not use drugs.   Family History: The patient's family history includes Cancer in her mother; Dementia in her maternal grandmother; Heart attack in her maternal grandfather; Hyperlipidemia in her sister; Other in  her father.   ROS:  Please see the history of present illness. Otherwise, complete review of systems is positive for none.  All other systems are reviewed and negative.   Physical Exam: VS:  BP 106/60   Pulse 88   Ht 4\' 7"  (1.397 m)   Wt 226 lb 12.8 oz (102.9 kg)   SpO2 98%   BMI 52.71 kg/m , BMI Body mass index is 52.71 kg/m.  Wt Readings from Last 3 Encounters:  01/15/21 226 lb 12.8 oz (102.9 kg)  01/13/21 223 lb 14.4 oz (101.6 kg)  12/18/20 225 lb 12.8 oz (102.4 kg)    General: Patient appears comfortable at rest. HEENT: Conjunctiva and lids normal, oropharynx clear with moist mucosa. Neck: Supple, no elevated JVP or carotid bruits, no thyromegaly. Lungs: Clear to auscultation, nonlabored breathing at rest. Cardiac: Regular rate and rhythm, no S3 or significant systolic murmur, no pericardial rub. Abdomen: Soft, nontender, no hepatomegaly, bowel sounds present, no guarding or rebound. Extremities: No pitting edema, distal pulses 2+. Skin: Warm and dry. Musculoskeletal: No kyphosis. Neuropsychiatric: Alert and oriented x3, affect grossly appropriate.  ECG:  An ECG dated 12/18/2020 was personally reviewed today and demonstrated:    normal sinus rhythm left bundle branch block rate of 72  Recent Labwork: 11/20/2020: B Natriuretic Peptide 36.0 01/12/2021: BUN 11; Creatinine, Ser 0.84; Magnesium 2.1; Platelets 313 01/13/2021: Hemoglobin 11.2; Hemoglobin 11.6; Potassium 4.1; Potassium 4.1; Sodium 140; Sodium 140  No results found for: CHOL, TRIG, HDL, CHOLHDL, VLDL, LDLCALC, LDLDIRECT  Other Studies Reviewed Today:   Cardiac catheterization 01/13/2021 RIGHT/LEFT HEART CATH AND CORONARY ANGIOGRAPHY    Conclusion    There is moderate to severe left ventricular systolic dysfunction. The left ventricular ejection fraction is 25-35% by visual estimate. LV end diastolic pressure is normal. There is no aortic valve stenosis. Ao sat 100%, PA sat 81%, mean PA pressure 24 mm Hg; mean  PCWP 14 mm Hg; CO 8.3 L/min; CI 4.5. Upper normal PA pressure. No angiographically apparent coronary artery disease.   Medical therapy for nonischemic cardiomyopathy.   Diagnostic Dominance: Right         Echocardiogram 11/07/2020   1. Left ventricular ejection fraction, by estimation, is 40 to 45%. The left ventricle has mildly decreased function. The left ventricle demonstrates global hypokinesis with paradoxical septal motion suggestive of LBBB. There is mild left ventricular hypertrophy. Left ventricular diastolic parameters were normal. 2. Right ventricular systolic function is normal. The right ventricular size is normal. There is severely elevated pulmonary artery systolic pressure. The estimated right ventricular systolic pressure is 71.6 mmHg (TR Doppler jet might be contaminated by LVOT flow). 3. Left atrial size was moderately dilated. 4. The mitral valve is grossly normal. Moderate mitral valve regurgitation, posteriorly directed. 5. The aortic valve is tricuspid. Aortic valve regurgitation is not visualized. 6. The inferior vena cava is normal in size with greater than 50% respiratory variability, suggesting right atrial pressure of 3 mmHg.  Assessment and Plan:  1. Chronic systolic heart failure (HCC)   2. Pulmonary hypertension, unspecified (HCC)   3. SOB (shortness of breath)   4. Suspected sleep apnea    1. Chronic systolic heart failure (HCC) Recent cardiac catheterization for elevated elevated pulmonary pressures.  Recent cardiac catheterization on 01/13/2021 showed EF of 25 to 35%, no aortic valve stenosis, AO sat 100%, PA sat 81%, mean PA pressure 24 mmHg.  Upper normal PA pressure.  Previous echo on 11/07/2020 demonstrated EF of 40 to 45%.  LV global hypokinesis and paradoxical septal motion suggestive of LBBB, mild LVH, severely elevated PASP at 71.6 mmHg.  Moderate MR.  Continue Entresto 24/26 mg p.o. twice daily.  Continue furosemide 80 mg daily.  Continue  Toprol-XL 12.5 mg daily.  Continue potassium supplementation 20 mEq daily.  No room to adjust Entresto based on blood pressures today.  Please refer to heart failure clinic for further evaluation of systolic heart failure.  2. Pulmonary hypertension, unspecified (HCC) Previous echocardiogram demonstrated elevated PASP of 71.6 mmHg.  Cardiac catheterization showed pulmonary pressures in the upper normal range.  3. SOB (shortness of breath) She states her shortness of breath has improved since starting Lasix and Entresto.  Please refer to pulmonary Dr. Vassie Loll for PFTs.  4. Suspected sleep apnea Patient states she only snores when she is significantly tired.  For to pulmonary for sleep apnea evaluation.  Medication Adjustments/Labs and Tests Ordered: Current medicines are reviewed at length with the patient today.  Concerns regarding medicines are outlined above.   Disposition: Follow-up with Dr. Wyline Mood or APP 3 months  Signed, Rennis Harding, NP 01/15/2021 11:33 AM    [facility] [address] [place], [place], [place] [address] Phone: [phone]; Fax: [phone]

## 2021-01-15 ENCOUNTER — Encounter: Payer: Self-pay | Admitting: Family Medicine

## 2021-01-15 ENCOUNTER — Ambulatory Visit (INDEPENDENT_AMBULATORY_CARE_PROVIDER_SITE_OTHER): Payer: 59 | Admitting: Family Medicine

## 2021-01-15 VITALS — BP 106/60 | HR 88 | Ht <= 58 in | Wt 226.8 lb

## 2021-01-15 DIAGNOSIS — I5022 Chronic systolic (congestive) heart failure: Secondary | ICD-10-CM | POA: Diagnosis not present

## 2021-01-15 DIAGNOSIS — I272 Pulmonary hypertension, unspecified: Secondary | ICD-10-CM

## 2021-01-15 DIAGNOSIS — R29818 Other symptoms and signs involving the nervous system: Secondary | ICD-10-CM | POA: Diagnosis not present

## 2021-01-15 DIAGNOSIS — R0602 Shortness of breath: Secondary | ICD-10-CM | POA: Diagnosis not present

## 2021-01-15 NOTE — Patient Instructions (Signed)
Medication Instructions:  Continue all current medications.  Labwork: none  Testing/Procedures: none  Follow-Up: 3 months   Any Other Special Instructions Will Be Listed Below (If Applicable). You have been referred to:  Heart Failure Clinic  You have been referred to:  Pulmonology   If you need a refill on your cardiac medications before your next appointment, please call your pharmacy.

## 2021-01-20 ENCOUNTER — Other Ambulatory Visit: Payer: Self-pay | Admitting: *Deleted

## 2021-01-20 ENCOUNTER — Encounter: Payer: Self-pay | Admitting: *Deleted

## 2021-01-20 DIAGNOSIS — R931 Abnormal findings on diagnostic imaging of heart and coronary circulation: Secondary | ICD-10-CM

## 2021-01-20 NOTE — Progress Notes (Signed)
Patient notified & agreed to doing the limited echo.

## 2021-01-26 ENCOUNTER — Telehealth: Payer: Self-pay | Admitting: Adult Health

## 2021-01-26 NOTE — Telephone Encounter (Signed)
Pt requesting Diflucan, last seen here 2018 Itching with urination Tried to explain to pt she would need OV due to not seen since 2018, pt states [name] will do it   Please advise & notify pt   [facility]

## 2021-01-27 MED ORDER — FLUCONAZOLE 150 MG PO TABS: ORAL_TABLET | ORAL | 1 refills | Status: DC

## 2021-01-27 NOTE — Telephone Encounter (Signed)
Sorry, this was not routed yesterday

## 2021-01-27 NOTE — Telephone Encounter (Signed)
Has vaginal itching esp when pees, will rx diflucan, and she saw some blood this morning, call back for appt to have urine checked with a nurse

## 2021-01-28 ENCOUNTER — Telehealth: Payer: Self-pay | Admitting: Cardiology

## 2021-01-28 ENCOUNTER — Other Ambulatory Visit: Payer: Self-pay

## 2021-01-28 ENCOUNTER — Other Ambulatory Visit (INDEPENDENT_AMBULATORY_CARE_PROVIDER_SITE_OTHER): Payer: 59 | Admitting: *Deleted

## 2021-01-28 DIAGNOSIS — R300 Dysuria: Secondary | ICD-10-CM | POA: Diagnosis not present

## 2021-01-28 LAB — POCT URINALYSIS DIPSTICK OB
Glucose, UA: NEGATIVE
Leukocytes, UA: NEGATIVE
Nitrite, UA: NEGATIVE

## 2021-01-28 NOTE — Telephone Encounter (Signed)
Ok to hold meds today, is SBP above 100 tomorrow would restart her toprol and remain off entresto for now. Update [place] on Monday on bp's  Dominga Ferry MD

## 2021-01-28 NOTE — Telephone Encounter (Signed)
Patient informed and verbalized understanding of plan. 

## 2021-01-28 NOTE — Progress Notes (Signed)
   NURSE VISIT- UTI SYMPTOMS   SUBJECTIVE:  [name] is a 50 y.o. [phone] female here for UTI symptoms. She is a GYN patient. She reports dysuria and vaginal itching .  OBJECTIVE:  There were no vitals taken for this visit.  Appears well, in no apparent distress  Results for orders placed or performed in visit on 01/28/21 (from the past 24 hour(s))  POC Urinalysis Dipstick OB   Collection Time: 01/28/21 10:30 AM  Result Value Ref Range   Color, UA     Clarity, UA     Glucose, UA Negative Negative   Bilirubin, UA     Ketones, UA small    Spec Grav, UA     Blood, UA trace    pH, UA     POC,PROTEIN,UA Small (1+) Negative, Trace, Small (1+), Moderate (2+), Large (3+), 4+   Urobilinogen, UA     Nitrite, UA neg    Leukocytes, UA Negative Negative   Appearance     Odor      ASSESSMENT: GYN patient with UTI symptoms and negative nitrites  PLAN: Note routed to Cyril Mourning, AGNP   Rx sent by provider today: No Urine culture sent Call or return to clinic prn if these symptoms worsen or fail to improve as anticipated. Follow-up: as needed   [name]  01/28/2021 10:31 AM

## 2021-01-28 NOTE — Telephone Encounter (Signed)
Pt c/o BP issue: STAT if pt c/o blurred vision, one-sided weakness or slurred speech  1. What are your last 5 BP readings? 107/60 90/58  2. Are you having any other symptoms (ex. Dizziness, headache, blurred vision, passed out)? Dizziness,   3. What is your BP issue? Patient is holding her medication this morning and will recheck it later

## 2021-01-28 NOTE — Progress Notes (Signed)
Chart reviewed for nurse visit. Agree with plan of care.  Adline Potter, NP 01/28/2021 1:33 PM

## 2021-01-29 LAB — URINALYSIS
Bilirubin, UA: NEGATIVE
Glucose, UA: NEGATIVE
Ketones, UA: NEGATIVE
Nitrite, UA: NEGATIVE
Specific Gravity, UA: 1.021 (ref 1.005–1.030)
Urobilinogen, Ur: 0.2 mg/dL (ref 0.2–1.0)
pH, UA: 5.5 (ref 5.0–7.5)

## 2021-02-01 LAB — URINE CULTURE

## 2021-02-02 ENCOUNTER — Other Ambulatory Visit: Payer: Self-pay | Admitting: Adult Health

## 2021-02-02 MED ORDER — SULFAMETHOXAZOLE-TRIMETHOPRIM 800-160 MG PO TABS: 1.0000 | ORAL_TABLET | Freq: Two times a day (BID) | ORAL | 0 refills | Status: DC

## 2021-02-02 NOTE — Progress Notes (Signed)
+   Ecoli on urine will rx septra ds  

## 2021-02-03 NOTE — Telephone Encounter (Signed)
Is she still off her heart meds? Off lasix? Likely she is dehydrated from fluid pill and that's why bp's have been lower. Please verify last doses of meds?   Dominga Ferry MD

## 2021-02-04 ENCOUNTER — Other Ambulatory Visit: Payer: Self-pay

## 2021-02-04 ENCOUNTER — Ambulatory Visit (HOSPITAL_COMMUNITY)
Admission: RE | Admit: 2021-02-04 | Discharge: 2021-02-04 | Disposition: A | Discharge status: Home / Self Care | Payer: 59 | Source: Ambulatory Visit | Attending: Family Medicine | Admitting: Family Medicine

## 2021-02-04 DIAGNOSIS — I5033 Acute on chronic diastolic (congestive) heart failure: Secondary | ICD-10-CM

## 2021-02-04 DIAGNOSIS — R931 Abnormal findings on diagnostic imaging of heart and coronary circulation: Secondary | ICD-10-CM

## 2021-02-04 LAB — ECHOCARDIOGRAM LIMITED
Calc EF: 55 %
S' Lateral: 3.55 cm
Single Plane A2C EF: 55.2 %
Single Plane A4C EF: 50.2 %

## 2021-02-04 NOTE — Progress Notes (Signed)
*  PRELIMINARY RESULTS* Echocardiogram Limited 2-D Echocardigram  has been performed.  [name] 02/04/2021, 2:41 PM

## 2021-02-05 ENCOUNTER — Telehealth: Payer: Self-pay | Admitting: *Deleted

## 2021-02-05 NOTE — Telephone Encounter (Signed)
Lesle Chris, LPN  7/62/8315  6:12 PM EDT Back to Top     Notified, copy to pcp.    Netta Neat., NP  02/04/2021  8:45 PM EDT      Please call the patient and let her know the echocardiogram shows the heart has good pumping function in the low normal range. The main pumping chamber ismildly more muscular than usual.     Netta Neat, NP  02/04/2021 8:44 PM

## 2021-02-06 ENCOUNTER — Encounter: Payer: Self-pay | Admitting: *Deleted

## 2021-02-11 NOTE — Telephone Encounter (Signed)
Are her bp's still running low? I would lower her lasix to 40mg  daily if they are running low   MD

## 2021-02-12 ENCOUNTER — Telehealth: Payer: Self-pay

## 2021-02-12 MED ORDER — FUROSEMIDE 20 MG PO TABS: 40.0000 mg | ORAL_TABLET | Freq: Every day | ORAL | 3 refills | Status: DC

## 2021-02-12 NOTE — Telephone Encounter (Signed)
Are her bp's still running low? I would lower her lasix to 40mg  daily if they are running low     MD   Pt stated that the highest her bp has been all week was 120/78.  S-100/64 M- 108/69 T- 111/71  Pt notified about decreasing lasix to 40 mg daily and voiced understanding. Pt will use remaining 20 mg tablets and pick up new Rx from pharmacy. Medication list updated to reflect changes.

## 2021-02-16 ENCOUNTER — Encounter: Payer: Self-pay | Admitting: *Deleted

## 2021-03-04 ENCOUNTER — Encounter (HOSPITAL_COMMUNITY): Payer: 59 | Admitting: Cardiology

## 2021-03-05 NOTE — Telephone Encounter (Signed)
Can take lasix 40mg  daily, ok to take additonal 76m (80mg  total) as needed for swelling. Its ok if she needs that extra lasix   43m MD

## 2021-03-06 ENCOUNTER — Ambulatory Visit: Payer: 59 | Admitting: Pulmonary Disease

## 2021-03-06 ENCOUNTER — Other Ambulatory Visit: Payer: Self-pay

## 2021-03-06 ENCOUNTER — Encounter: Payer: Self-pay | Admitting: Pulmonary Disease

## 2021-03-06 VITALS — BP 130/80 | HR 84 | Temp 99.6°F | Ht <= 58 in | Wt 227.4 lb

## 2021-03-06 DIAGNOSIS — G4733 Obstructive sleep apnea (adult) (pediatric): Secondary | ICD-10-CM | POA: Diagnosis not present

## 2021-03-06 DIAGNOSIS — I272 Pulmonary hypertension, unspecified: Secondary | ICD-10-CM | POA: Diagnosis not present

## 2021-03-06 DIAGNOSIS — R0683 Snoring: Secondary | ICD-10-CM | POA: Diagnosis not present

## 2021-03-06 NOTE — Progress Notes (Signed)
Subjective:    Patient ID: [name], female    DOB: Mar 27, 1971, 50 y.o.   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 She is a lifetime never smoker.  She had COVID infection 04/2019.  She is now vaccinated and boosted.  Following this she developed shortness of breath.  She went to weight loss clinic and took phentermine for 2 months starting April 2021.  She developed fluid retention in 2022 and saw physician in urgent care, chest x-ray reviewed from 03/28/2020 and 09/28/2020 shows cardiomegaly with mild interstitial prominence and prominent right pulmonary artery but no effusions or infiltrates.  She was finally referred to cardiology when she was hypoxic and found to have EF of 40 to 45% on echo 11/2020 with severe pulmonary hypertension, RVSP 72. BNP was only 36. She underwent cardiac cath 01/2021 which showed EF of 25%, LVEDP was normal, PCWP was 14, cardiac index 4.5, PA pressure was 24.  Follow-up echo 02/04/2021 showed normalization of EF 50 to 55% . Entresto was stopped due to soft blood pressure.  Lasix was started from 20-80 and now she is on 40 mg daily as fluid issues have resolved.  Her breathing is back to baseline.  She denies excessive daytime somnolence, Epworth sleepiness score is 2. Snoring has been noted by family members, she lives alone, no bed partner history is available. Bedtime is between 10 PM and midnight, sleep latency about 30 minutes, she sleeps on her left side with 1 pillow, reports 1-2 nocturnal awakenings due to nocturia and is out of bed between 6 and 8 AM with dryness of mouth but denies headaches or fatigue. Weight has decreased 12 pounds over the past few months  There is no history suggestive of cataplexy, sleep paralysis or parasomnias   Significant tests/ events reviewed    Past Medical History:  Diagnosis Date   Anemia    Hernia,  inguinal    Medical history non-contributory    Ventral hernia 10/02/2015   Past Surgical History:  Procedure Laterality Date   ABDOMINAL HYSTERECTOMY     CESAREAN SECTION     x 2   ESOPHAGOGASTRODUODENOSCOPY N/A 11/28/2013   Procedure: ESOPHAGOGASTRODUODENOSCOPY (EGD);  Surgeon: Corbin Ade, MD;  [facility];  Service: Endoscopy;  Laterality: N/A;  12:15   EYE SURGERY     INCISIONAL HERNIA REPAIR N/A 11/24/2015   Procedure: Sherald Hess HERNIORRHAPHY WITH MESH;  Surgeon: Franky Macho, MD;  Location: [facility];  Service: General;  Laterality: N/A;   INSERTION OF MESH N/A 11/24/2015   Procedure: INSERTION OF MESH;  Surgeon: Franky Macho, MD;  Location: [facility];  Service: General;  Laterality: N/A;   RIGHT/LEFT HEART CATH AND CORONARY ANGIOGRAPHY N/A 01/13/2021   Procedure: RIGHT/LEFT HEART CATH AND CORONARY ANGIOGRAPHY;  Surgeon: Corky Crafts, MD;  Location: [facility] INVASIVE CV LAB;  Service: Cardiovascular;  Laterality: N/A;   TONSILLECTOMY      Allergies  Allergen Reactions   Other     Peaches- hives     Social History   Socioeconomic History   Marital status: Single    Spouse name: Not on file   Number of children: Not on file   Years of education: Not on file   Highest education level: Not on file  Occupational History   Occupation: [employer]    Employer: [employer]  Tobacco Use   Smoking status:  Never   Smokeless tobacco: Never  Vaping Use   Vaping Use: Never used  Substance and Sexual Activity   Alcohol use: Yes    Comment: rarely   Drug use: No   Sexual activity: Not Currently    Birth control/protection: Surgical    Comment: hyst  Other Topics Concern   Not on file  Social History Narrative   Not on file   Social Determinants of Health   Financial Resource Strain: Not on file  Food Insecurity: Not on file  Transportation Needs: Not on file  Physical Activity: Not on file  Stress: Not on file  Social Connections: Not on  file  Intimate Partner Violence: Not on file     Family History  Problem Relation Age of Onset   Cancer Mother        lung   Other Father        was shot and killed   Hyperlipidemia Sister    Dementia Maternal Grandmother    Heart attack Maternal Grandfather    Colon cancer Neg Hx      Review of Systems  Shortness of breath with activity, now improved Dental problems Anxiety Ankle swelling which is improved   Constitutional: negative for anorexia, fevers and sweats  Eyes: negative for irritation, redness and visual disturbance  Ears, nose, mouth, throat, and face: negative for earaches, epistaxis, nasal congestion and sore throat  Respiratory: negative for cough, sputum and wheezing  Cardiovascular: negative for chest pain, orthopnea, palpitations and syncope  Gastrointestinal: negative for abdominal pain, constipation, diarrhea, melena, nausea and vomiting  Genitourinary:negative for dysuria, frequency and hematuria  Hematologic/lymphatic: negative for bleeding, easy bruising and lymphadenopathy  Musculoskeletal:negative for arthralgias, muscle weakness and stiff joints  Neurological: negative for coordination problems, gait problems, headaches and weakness  Endocrine: negative for diabetic symptoms including polydipsia, polyuria and weight loss   Constitutional: negative for anorexia, fevers and sweats  Eyes: negative for irritation, redness and visual disturbance  Ears, nose, mouth, throat, and face: negative for earaches, epistaxis, nasal congestion and sore throat  Respiratory: negative for cough, dyspnea on exertion, sputum and wheezing  Cardiovascular: negative for chest pain, dyspnea, lower extremity edema, orthopnea, palpitations and syncope  Gastrointestinal: negative for abdominal pain, constipation, diarrhea, melena, nausea and vomiting  Genitourinary:negative for dysuria, frequency and hematuria  Hematologic/lymphatic: negative for bleeding, easy bruising  and lymphadenopathy  Musculoskeletal:negative for arthralgias, muscle weakness and stiff joints  Neurological: negative for coordination problems, gait problems, headaches and weakness  Endocrine: negative for diabetic symptoms including polydipsia, polyuria and weight loss     Objective:   Physical Exam  Gen. Pleasant, obese, in no distress, normal affect, short stature ENT - no pallor,icterus, no post nasal drip, class 2-3 airway Neck: No JVD, no thyromegaly, no carotid bruits Lungs: no use of accessory muscles, no dullness to percussion, decreased without rales or rhonchi  Cardiovascular: Rhythm regular, heart sounds  normal, no murmurs or gallops, no peripheral edema Abdomen: soft and non-tender, no hepatosplenomegaly, BS normal. Musculoskeletal: No deformities, no cyanosis or clubbing Neuro:  alert, non focal, no tremors       Assessment & Plan:     Pulmonary hypertension -this was transient and obviously related to acute systolic heart failure.  This is resolved with diuresis and improvement in LV function, both on cardiac cath and follow-up echo.  No further work-up necessary for this.  This does not seem to have been related to phentermine  OSA -Given excessive daytime fatigue, narrow  pharyngeal exam, & loud snoring, obstructive sleep apnea is  possible & an overnight polysomnogram will be scheduled as a home study. The pathophysiology of obstructive sleep apnea , it's cardiovascular consequences & modes of treatment including CPAP were discused with the patient in detail & they evidenced understanding.

## 2021-03-06 NOTE — Patient Instructions (Signed)
Home sleep study 

## 2021-03-08 ENCOUNTER — Other Ambulatory Visit: Payer: Self-pay | Admitting: Cardiology

## 2021-03-11 ENCOUNTER — Ambulatory Visit (INDEPENDENT_AMBULATORY_CARE_PROVIDER_SITE_OTHER): Payer: 59 | Admitting: Adult Health

## 2021-03-11 ENCOUNTER — Other Ambulatory Visit: Payer: Self-pay

## 2021-03-11 ENCOUNTER — Encounter: Payer: Self-pay | Admitting: Adult Health

## 2021-03-11 VITALS — BP 130/83 | HR 74 | Ht <= 58 in | Wt 229.0 lb

## 2021-03-11 DIAGNOSIS — Z1211 Encounter for screening for malignant neoplasm of colon: Secondary | ICD-10-CM | POA: Diagnosis not present

## 2021-03-11 DIAGNOSIS — R232 Flushing: Secondary | ICD-10-CM | POA: Insufficient documentation

## 2021-03-11 DIAGNOSIS — Z01419 Encounter for gynecological examination (general) (routine) without abnormal findings: Secondary | ICD-10-CM | POA: Insufficient documentation

## 2021-03-11 DIAGNOSIS — Z90710 Acquired absence of both cervix and uterus: Secondary | ICD-10-CM | POA: Diagnosis not present

## 2021-03-11 LAB — HEMOCCULT GUIAC POC 1CARD (OFFICE): Fecal Occult Blood, POC: NEGATIVE

## 2021-03-11 NOTE — Progress Notes (Signed)
Patient ID: [name], female   DOB: 02/11/1971, 50 y.o.   MRN: [id number] History of Present Illness:  [name] is a 50 year old black female,single, sp hysterectomy in for a well woman gyn exam. She is working at [employer]. PCP is [facility].    Current Medications, Allergies, Past Medical History, Past Surgical History, Family History and Social History were reviewed in [employer] record.     Review of Systems:  Patient denies any headaches, hearing loss, fatigue, blurred vision,chest pain, abdominal pain, problems with bowel movements, urination, or intercourse. No joint pain or mood swings.  +hot flashes has had since 20's She had COVID and was short of breath and has seen pulmonary and cardiology, has been diagnosed with heart failure. Has been doing AHA challenge.    Physical Exam:BP 130/83 (BP Location: Right Arm, Patient Position: Sitting, Cuff Size: Normal)   Pulse 74   Ht 4' 7.5" (1.41 m)   Wt 229 lb (103.9 kg)   BMI 52.27 kg/m   General:  Well developed, well nourished, no acute distress Skin:  Warm and dry Neck:  Midline trachea, normal thyroid, good ROM, no lymphadenopathy Lungs; Clear to auscultation bilaterally Breast:  No dominant palpable mass, retraction, or nipple discharge Cardiovascular: Regular rate and rhythm Abdomen:  Soft, non tender, no hepatosplenomegaly Pelvic:  External genitalia is normal in appearance, no lesions.  The vagina is normal in appearance. Urethra has no lesions or masses. The cervix and uterus are absent.No adnexal masses or tenderness noted.Bladder is non tender, no masses felt. Rectal: Good sphincter tone, no polyps, or hemorrhoids felt.  Hemoccult negative. Extremities/musculoskeletal:  No swelling or varicosities noted, no clubbing or cyanosis Psych:  No mood changes, alert and cooperative,seems happy AA is 2 Fall risk is low Depression screen PHQ 2/9 03/11/2021  Decreased Interest 0  Down,  Depressed, Hopeless 0  PHQ - 2 Score 0  Altered sleeping 0  Tired, decreased energy 0  Change in appetite 0  Feeling bad or failure about yourself  0  Trouble concentrating 0  Moving slowly or fidgety/restless 0  Suicidal thoughts 0  PHQ-9 Score 0   GAD 7 : Generalized Anxiety Score 03/11/2021  Nervous, Anxious, on Edge 0  Control/stop worrying 0  Worry too much - different things 0  Trouble relaxing 0  Restless 0  Easily annoyed or irritable 3  Afraid - awful might happen 0  Total GAD 7 Score 3      Upstream - 03/11/21 0844       Pregnancy Intention Screening   Does the patient want to become pregnant in the next year? N/A    Does the patient's partner want to become pregnant in the next year? N/A    Would the patient like to discuss contraceptive options today? N/A      Contraception Wrap Up   Current Method Female Sterilization   hysterectomy   End Method Female Sterilization   hysterectomy   Contraception Counseling Provided No            Examination chaperoned by Faith Rogue LPN  Impression and Plan: 1. Encounter for well woman exam with routine gynecological exam Physical in 1 year Labs with cardiology She says she had mammogram last year in [place]  Colonoscopy advised, can talk with PCP  2. Encounter for screening fecal occult blood testing   3. S/P hysterectomy   4. Hot flashes Will watch for now

## 2021-03-17 ENCOUNTER — Ambulatory Visit (INDEPENDENT_AMBULATORY_CARE_PROVIDER_SITE_OTHER): Payer: 59 | Admitting: Cardiology

## 2021-03-17 ENCOUNTER — Encounter: Payer: Self-pay | Admitting: Cardiology

## 2021-03-17 ENCOUNTER — Other Ambulatory Visit: Payer: Self-pay

## 2021-03-17 VITALS — BP 138/70 | HR 65 | Ht <= 58 in | Wt 228.6 lb

## 2021-03-17 DIAGNOSIS — I272 Pulmonary hypertension, unspecified: Secondary | ICD-10-CM

## 2021-03-17 DIAGNOSIS — I5022 Chronic systolic (congestive) heart failure: Secondary | ICD-10-CM

## 2021-03-17 DIAGNOSIS — E6609 Other obesity due to excess calories: Secondary | ICD-10-CM

## 2021-03-17 NOTE — Progress Notes (Signed)
Clinical Summary [name] is a 50 y.o.female seen today for follow up of the following medical problems.      1. Chronic sysotlic HF - 11/2020 echo LVEF 40-45%, normal diastolic fuxn, normal RV function, severe pulm HTN PASP 72 -BNP 36     01/2021 RHC/LHC: no significant CAD. Mean PA 24, PCWP 14, CI 4.5 01/2021 echo: LVEF 50-55%, normal RV function.    - issues with low bp's, entresto was stopped and lasix was decreased  - no SOB, no significant edema. Home weight stable 222-225. Down about 10 lbs since initial visit when fluid overloaded.  - compliant with meds.  - home bp's 108-117. Takes lasix 40mg  daily.      2. LBBB - unknown chronicity, new compared to 2015 EKG>      3. 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     furosemide (LASIX) 20 MG tablet Take 2 tablets (40 mg total) by mouth daily. 180 tablet 3   levocetirizine (XYZAL) 5 MG tablet Take 5 mg by mouth every evening.     metoprolol succinate (TOPROL XL) 25 MG 24 hr tablet Take 0.5 tablets (12.5 mg total) by mouth daily. 45 tablet 1   Multiple Vitamins-Minerals (MEGA MULTIVITAMIN FOR WOMEN) TABS Take by mouth.     potassium chloride SA (KLOR-CON M20) 20 MEQ tablet TAKE TWO TABLETS (40 MEQ) BY MOUTH TWICE DAILY ON DAY 1, THEN TAKE TWO TABLETS (40 MEQ) DAILY 180 tablet 0   No current facility-administered medications for this visit.     Past Surgical History:  Procedure Laterality Date   ABDOMINAL HYSTERECTOMY     CESAREAN SECTION     x 2   ESOPHAGOGASTRODUODENOSCOPY N/A 11/28/2013   Procedure:  ESOPHAGOGASTRODUODENOSCOPY (EGD);  Surgeon: 11/30/2013, MD;  [facility];  Service: Endoscopy;  Laterality: N/A;  12:15   EYE SURGERY     INCISIONAL HERNIA REPAIR N/A 11/24/2015   Procedure: 11/26/2015 HERNIORRHAPHY WITH MESH;  Surgeon: Sherald Hess, MD;  Location: [facility];  Service: General;  Laterality: N/A;   INSERTION OF MESH N/A 11/24/2015   Procedure: INSERTION OF MESH;  Surgeon: 11/26/2015, MD;  Location: [facility];  Service: General;  Laterality: N/A;   RIGHT/LEFT HEART CATH AND CORONARY ANGIOGRAPHY N/A 01/13/2021   Procedure: RIGHT/LEFT HEART CATH AND CORONARY ANGIOGRAPHY;  Surgeon: 03/15/2021, MD;  Location: [facility] INVASIVE CV LAB;  Service: Cardiovascular;  Laterality: N/A;   TONSILLECTOMY       Allergies  Allergen Reactions   Other     Peaches- hives      Family History  Problem Relation Age of Onset   Cancer Mother        lung   Other Father        was shot and killed   Hyperlipidemia Sister    Dementia Maternal Grandmother    Heart attack Maternal Grandfather    Colon cancer Neg Hx      Social  History [name] reports that she has never smoked. She has never used smokeless tobacco. [name] reports current alcohol use.   Review of Systems CONSTITUTIONAL: No weight loss, fever, chills, weakness or fatigue.  HEENT: Eyes: No visual loss, blurred vision, double vision or yellow sclerae.No hearing loss, sneezing, congestion, runny nose or sore throat.  SKIN: No rash or itching.  CARDIOVASCULAR: per hpi RESPIRATORY: No shortness of breath, cough or sputum.  GASTROINTESTINAL: No anorexia, nausea, vomiting or diarrhea. No abdominal pain or blood.  GENITOURINARY: No burning on urination, no polyuria NEUROLOGICAL: No headache, dizziness, syncope, paralysis, ataxia, numbness or tingling in the extremities. No change in bowel or bladder control.  MUSCULOSKELETAL: No muscle, back pain, joint pain or stiffness.  LYMPHATICS: No enlarged nodes. No  history of splenectomy.  PSYCHIATRIC: No history of depression or anxiety.  ENDOCRINOLOGIC: No reports of sweating, cold or heat intolerance. No polyuria or polydipsia.  Marland Kitchen   Physical Examination Today's Vitals   03/17/21 1430  BP: 138/70  Pulse: 65  SpO2: 98%  Weight: 228 lb 9.6 oz (103.7 kg)  Height: 4\' 7"  (1.397 m)   Body mass index is 53.13 kg/m.  Gen: resting comfortably, no acute distress HEENT: no scleral icterus, pupils equal round and reactive, no palptable cervical adenopathy,  CV: RRR, no m/r/g, no jvd Resp: Clear to auscultation bilaterally GI: abdomen is soft, non-tender, non-distended, normal bowel sounds, no hepatosplenomegaly MSK: extremities are warm, no edema.  Skin: warm, no rash Neuro:  no focal deficits Psych: appropriate affect   Diagnostic Studies  11/2020 echo IMPRESSIONS     1. Left ventricular ejection fraction, by estimation, is 40 to 45%. The  left ventricle has mildly decreased function. The left ventricle  demonstrates global hypokinesis with paradoxical septal motion suggestive  of LBBB. There is mild left ventricular  hypertrophy. Left ventricular diastolic parameters were normal.   2. Right ventricular systolic function is normal. The right ventricular  size is normal. There is severely elevated pulmonary artery systolic  pressure. The estimated right ventricular systolic pressure is 71.6 mmHg  (TR Doppler jet might be contaminated  by LVOT flow).   3. Left atrial size was moderately dilated.   4. The mitral valve is grossly normal. Moderate mitral valve  regurgitation, posteriorly directed.   5. The aortic valve is tricuspid. Aortic valve regurgitation is not  visualized.   6. The inferior vena cava is normal in size with greater than 50%  respiratory variability, suggesting right atrial pressure of 3 mmHg.   01/2021 RHC/LHC There is moderate to severe left ventricular systolic dysfunction. The left ventricular ejection fraction is  25-35% by visual estimate. LV end diastolic pressure is normal. There is no aortic valve stenosis. Ao sat 100%, PA sat 81%, mean PA pressure 24 mm Hg; mean PCWP 14 mm Hg; CO 8.3 L/min; CI 4.5. Upper normal PA pressure. No angiographically apparent coronary artery disease.   Medical therapy for nonischemic cardiomyopathy.  Assessment and Plan   1. Chronic systolic HF - new diagnosis based on 11/2020 echo, LVEF 40-45% - LVEF has since normalized. Dramatic improvement in symptoms with diuresis, down about 10 lbs since initial visit - cath without significant coronary disease - medical therapy limited by low bp's, had to d/c entresto. Home sbps still low 100s, continue just toprol 12.5mg  daily and alsix 40mg  daily.    2. Pulmonary HTN - severe by echo, normal RV function - RHC showed high normal PA pressure, this was after significant diuresis -  continue diuretic     3. Chronic LBBB     Antoine Poche, M.D.

## 2021-03-17 NOTE — Patient Instructions (Addendum)
Medication Instructions:  Continue all current medications.  Labwork: none  Testing/Procedures: none  Follow-Up: 6 months   Any Other Special Instructions Will Be Listed Below (If Applicable). You have been referred to:  [facility]  If you need a refill on your cardiac medications before your next appointment, please call your pharmacy.

## 2021-04-02 ENCOUNTER — Other Ambulatory Visit: Payer: Self-pay

## 2021-04-02 ENCOUNTER — Ambulatory Visit: Payer: 59

## 2021-04-02 DIAGNOSIS — G4733 Obstructive sleep apnea (adult) (pediatric): Secondary | ICD-10-CM

## 2021-04-02 DIAGNOSIS — R0683 Snoring: Secondary | ICD-10-CM

## 2021-04-09 ENCOUNTER — Telehealth: Payer: Self-pay | Admitting: Pulmonary Disease

## 2021-04-09 DIAGNOSIS — G4733 Obstructive sleep apnea (adult) (pediatric): Secondary | ICD-10-CM

## 2021-04-09 NOTE — Telephone Encounter (Signed)
Spoke with pt and notified of results per Dr. Vassie Loll. Pt verbalized understanding and denied any questions. I put in a 6 month recall for f/u in [place], and if she is not having symptoms she is aware she does not need f/u.

## 2021-04-09 NOTE — Telephone Encounter (Signed)
Home sleep test showed very mild OSA 6/hour Main treatment would be weight loss for this mild degree of sleep apnea.  Snoring was loud If symptoms persist she can follow-up with [place] in 6 months to a year

## 2021-04-17 ENCOUNTER — Ambulatory Visit: Payer: 59 | Admitting: Family Medicine

## 2021-06-12 ENCOUNTER — Telehealth: Payer: Self-pay | Admitting: Cardiology

## 2021-06-12 MED ORDER — POTASSIUM CHLORIDE CRYS ER 20 MEQ PO TBCR: 40.0000 meq | EXTENDED_RELEASE_TABLET | Freq: Every day | ORAL | 3 refills | Status: DC

## 2021-06-12 MED ORDER — METOPROLOL SUCCINATE ER 25 MG PO TB24: 12.5000 mg | ORAL_TABLET | Freq: Every day | ORAL | 2 refills | Status: DC

## 2021-06-12 NOTE — Telephone Encounter (Signed)
*  STAT* If patient is at the pharmacy, call can be transferred to refill team.   1. Which medications need to be refilled? (please list name of each medication and dose if known) metoprolol succinate (TOPROL XL) 25 MG 24 hr tablet    potassium chloride SA (KLOR-CON M20) 20 MEQ tablet    2. Which pharmacy/location (including street and city if local pharmacy) is medication to be sent to? [facility] [address] [place] - [address]   3. Do they need a 30 day or 90 day supply? metoprolol succinate (TOPROL XL) 25 MG 24 hr tablet   30ds  potassium chloride SA (KLOR-CON M20) 20 MEQ tablet 90ds

## 2021-09-10 ENCOUNTER — Ambulatory Visit (INDEPENDENT_AMBULATORY_CARE_PROVIDER_SITE_OTHER): Payer: Self-pay | Admitting: Bariatrics

## 2021-09-24 ENCOUNTER — Ambulatory Visit (INDEPENDENT_AMBULATORY_CARE_PROVIDER_SITE_OTHER): Payer: Self-pay | Admitting: Bariatrics

## 2021-09-29 ENCOUNTER — Ambulatory Visit (INDEPENDENT_AMBULATORY_CARE_PROVIDER_SITE_OTHER): Payer: Commercial Managed Care - PPO | Admitting: Cardiology

## 2021-09-29 ENCOUNTER — Encounter: Payer: Self-pay | Admitting: Cardiology

## 2021-09-29 VITALS — BP 124/70 | HR 72 | Ht <= 58 in | Wt 227.2 lb

## 2021-09-29 DIAGNOSIS — I5022 Chronic systolic (congestive) heart failure: Secondary | ICD-10-CM

## 2021-09-29 DIAGNOSIS — Z136 Encounter for screening for cardiovascular disorders: Secondary | ICD-10-CM | POA: Diagnosis not present

## 2021-09-29 DIAGNOSIS — I272 Pulmonary hypertension, unspecified: Secondary | ICD-10-CM | POA: Diagnosis not present

## 2021-09-29 DIAGNOSIS — R5383 Other fatigue: Secondary | ICD-10-CM

## 2021-09-29 DIAGNOSIS — R7301 Impaired fasting glucose: Secondary | ICD-10-CM

## 2021-09-29 DIAGNOSIS — R0602 Shortness of breath: Secondary | ICD-10-CM | POA: Diagnosis not present

## 2021-09-29 NOTE — Patient Instructions (Addendum)
Medication Instructions:  Your physician recommends that you continue on your current medications as directed. Please refer to the Current Medication list given to you today.   Labwork: Your physician recommends that you return for lab work in:  CMET, Mag, CBC, TSH, Hgb, Lipid  Testing/Procedures: none  Follow-Up: Your physician recommends that you schedule a follow-up appointment in: 6 month f/u  Any Other Special Instructions Will Be Listed Below (If Applicable).  If you need a refill on your cardiac medications before your next appointment, please call your pharmacy.

## 2021-09-29 NOTE — Progress Notes (Signed)
Clinical Summary [name] is a 51 y.o.female seen today for follow up of the following medical problems.          1. Chronic sysotlic HF - 11/2020 echo LVEF 40-45%, normal diastolic fuxn, normal RV function, severe pulm HTN PASP 72 -BNP 36      01/2021 RHC/LHC: no significant CAD. Mean PA 24, PCWP 14, CI 4.5 01/2021 echo: LVEF 50-55%, normal RV function.      - issues with low bp's, entresto was stopped and lasix was decreased    - no SOB/DOE. No edema - compliant with meds - home weights 223-224 lbs which is stable.  - taking lasix 40mg  daily.        2. LBBB - unknown chronicity, new compared to 2015 EKG>      3. 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   Past Medical History:  Diagnosis Date   Anemia    Hernia, inguinal    Medical history non-contributory    Ventral hernia 10/02/2015     Allergies  Allergen Reactions   Other     Peaches- hives     Current Outpatient Medications  Medication Sig Dispense Refill   Biotin w/ Vitamins C & E (HAIR SKIN & NAILS GUMMIES PO) Take 2 each by mouth daily.     furosemide (LASIX) 20 MG tablet Take 2 tablets (40 mg total) by mouth daily. 180 tablet 3   levocetirizine (XYZAL) 5 MG tablet Take 5 mg by mouth every evening.     metoprolol succinate (TOPROL XL) 25 MG 24 hr tablet Take 0.5 tablets (12.5 mg total) by mouth daily. 45 tablet 2   Multiple Vitamins-Minerals (MEGA MULTIVITAMIN FOR WOMEN) TABS Take by mouth.     potassium chloride SA (KLOR-CON M20) 20 MEQ tablet Take 2 tablets (40 mEq total) by mouth daily. 180 tablet 3   No current facility-administered medications for this visit.     Past Surgical History:  Procedure Laterality Date   ABDOMINAL HYSTERECTOMY     CESAREAN SECTION     x 2    ESOPHAGOGASTRODUODENOSCOPY N/A 11/28/2013   Procedure: ESOPHAGOGASTRODUODENOSCOPY (EGD);  Surgeon: Corbin Ade, MD;  [facility];  Service: Endoscopy;  Laterality: N/A;  12:15   EYE SURGERY     INCISIONAL HERNIA REPAIR N/A 11/24/2015   Procedure: Sherald Hess HERNIORRHAPHY WITH MESH;  Surgeon: Franky Macho, MD;  Location: [facility];  Service: General;  Laterality: N/A;   INSERTION OF MESH N/A 11/24/2015   Procedure: INSERTION OF MESH;  Surgeon: Franky Macho, MD;  Location: [facility];  Service: General;  Laterality: N/A;   RIGHT/LEFT HEART CATH AND CORONARY ANGIOGRAPHY N/A 01/13/2021   Procedure: RIGHT/LEFT HEART CATH AND CORONARY ANGIOGRAPHY;  Surgeon: Corky Crafts, MD;  Location: [facility] INVASIVE CV LAB;  Service: Cardiovascular;  Laterality: N/A;   TONSILLECTOMY       Allergies  Allergen Reactions   Other     Peaches- hives      Family History  Problem Relation Age of Onset   Cancer Mother        lung   Other Father        was shot and killed   Hyperlipidemia Sister  Dementia Maternal Grandmother    Heart attack Maternal Grandfather    Colon cancer Neg Hx      Social History Ms. Ilano reports that she has never smoked. She has never used smokeless tobacco. Ms. Gaur reports current alcohol use.   Review of Systems CONSTITUTIONAL: No weight loss, fever, chills, weakness or fatigue.  HEENT: Eyes: No visual loss, blurred vision, double vision or yellow sclerae.No hearing loss, sneezing, congestion, runny nose or sore throat.  SKIN: No rash or itching.  CARDIOVASCULAR: per hpi RESPIRATORY: No shortness of breath, cough or sputum.  GASTROINTESTINAL: No anorexia, nausea, vomiting or diarrhea. No abdominal pain or blood.  GENITOURINARY: No burning on urination, no polyuria NEUROLOGICAL: No headache, dizziness, syncope, paralysis, ataxia, numbness or tingling in the extremities. No change in bowel or bladder control.  MUSCULOSKELETAL: No muscle, back pain, joint  pain or stiffness.  LYMPHATICS: No enlarged nodes. No history of splenectomy.  PSYCHIATRIC: No history of depression or anxiety.  ENDOCRINOLOGIC: No reports of sweating, cold or heat intolerance. No polyuria or polydipsia.  Marland Kitchen   Physical Examination Today's Vitals   09/29/21 1259  BP: 124/70  Pulse: 72  SpO2: 98%  Weight: 227 lb 3.2 oz (103.1 kg)  Height: 4\' 7"  (1.397 m)   Body mass index is 52.81 kg/m.  Gen: resting comfortably, no acute distress HEENT: no scleral icterus, pupils equal round and reactive, no palptable cervical adenopathy,  CV: RRR, no m/r/g no jvd Resp: Clear to auscultation bilaterally GI: abdomen is soft, non-tender, non-distended, normal bowel sounds, no hepatosplenomegaly MSK: extremities are warm, no edema.  Skin: warm, no rash Neuro:  no focal deficits Psych: appropriate affect   Diagnostic Studies 11/2020 echo IMPRESSIONS     1. Left ventricular ejection fraction, by estimation, is 40 to 45%. The  left ventricle has mildly decreased function. The left ventricle  demonstrates global hypokinesis with paradoxical septal motion suggestive  of LBBB. There is mild left ventricular  hypertrophy. Left ventricular diastolic parameters were normal.   2. Right ventricular systolic function is normal. The right ventricular  size is normal. There is severely elevated pulmonary artery systolic  pressure. The estimated right ventricular systolic pressure is 0000000 mmHg  (TR Doppler jet might be contaminated  by LVOT flow).   3. Left atrial size was moderately dilated.   4. The mitral valve is grossly normal. Moderate mitral valve  regurgitation, posteriorly directed.   5. The aortic valve is tricuspid. Aortic valve regurgitation is not  visualized.   6. The inferior vena cava is normal in size with greater than 50%  respiratory variability, suggesting right atrial pressure of 3 mmHg.   01/2021 RHC/LHC There is moderate to severe left ventricular systolic  dysfunction. The left ventricular ejection fraction is 25-35% by visual estimate. LV end diastolic pressure is normal. There is no aortic valve stenosis. Ao sat 100%, PA sat 81%, mean PA pressure 24 mm Hg; mean PCWP 14 mm Hg; CO 8.3 L/min; CI 4.5. Upper normal PA pressure. No angiographically apparent coronary artery disease.   Medical therapy for nonischemic cardiomyopathy.      Assessment and Plan  1. Chronic systolic HF - new diagnosis based on 11/2020 echo, LVEF 40-45% - LVEF has since normalized - cath without significant coronary disease - medical therapy limited by low bp's, had to d/c entresto.  - continues to do well. Euvoelmic today without symptoms, home weights remain stable at goal - repeat annual labs, continue current meds   2. Pulmonary  HTN - severe by echo, normal RV function - RHC showed high normal PA pressure, this was after significant diuresis - pulm HTN was secondary to left sided heart disease that has since imrpvoed with resolution of pulm HTN.      3. Chronic LBBB      Arnoldo Lenis, M.D.,

## 2021-09-30 ENCOUNTER — Telehealth: Payer: Self-pay | Admitting: Cardiology

## 2021-09-30 NOTE — Telephone Encounter (Signed)
Lab orders faxed to [facility]) per patient request. Patient made aware, verbalized understanding.

## 2021-09-30 NOTE — Telephone Encounter (Signed)
° °  Pt said, she went to [facility] for the lab test and they dont do lab test for [facility] there only drug test, she said she has to go to [facility] in [address] and just to send her order there. She said she will try to go there on friday

## 2021-10-05 LAB — COMPREHENSIVE METABOLIC PANEL WITH GFR
ALT: 12 IU/L (ref 0–32)
AST: 16 IU/L (ref 0–40)
Albumin/Globulin Ratio: 1.5 (ref 1.2–2.2)
Albumin: 4.5 g/dL (ref 3.8–4.8)
Alkaline Phosphatase: 69 IU/L (ref 44–121)
BUN/Creatinine Ratio: 11 (ref 9–23)
BUN: 10 mg/dL (ref 6–24)
Bilirubin Total: 0.3 mg/dL (ref 0.0–1.2)
CO2: 23 mmol/L (ref 20–29)
Calcium: 9.4 mg/dL (ref 8.7–10.2)
Chloride: 102 mmol/L (ref 96–106)
Creatinine, Ser: 0.92 mg/dL (ref 0.57–1.00)
Globulin, Total: 3.1 g/dL (ref 1.5–4.5)
Glucose: 99 mg/dL (ref 70–99)
Potassium: 4.4 mmol/L (ref 3.5–5.2)
Sodium: 141 mmol/L (ref 134–144)
Total Protein: 7.6 g/dL (ref 6.0–8.5)
eGFR: 77 mL/min/1.73

## 2021-10-05 LAB — CBC
Hematocrit: 35 % (ref 34.0–46.6)
Hemoglobin: 11.9 g/dL (ref 11.1–15.9)
MCH: 29.6 pg (ref 26.6–33.0)
MCHC: 34 g/dL (ref 31.5–35.7)
MCV: 87 fL (ref 79–97)
Platelets: 300 10*3/uL (ref 150–450)
RBC: 4.02 x10E6/uL (ref 3.77–5.28)
RDW: 14.2 % (ref 11.7–15.4)
WBC: 7.4 10*3/uL (ref 3.4–10.8)

## 2021-10-05 LAB — LIPID PANEL
Chol/HDL Ratio: 3.9 ratio (ref 0.0–4.4)
Cholesterol, Total: 218 mg/dL — ABNORMAL HIGH (ref 100–199)
HDL: 56 mg/dL
LDL Chol Calc (NIH): 151 mg/dL — ABNORMAL HIGH (ref 0–99)
Triglycerides: 63 mg/dL (ref 0–149)
VLDL Cholesterol Cal: 11 mg/dL (ref 5–40)

## 2021-10-05 LAB — HEMOGLOBIN A1C
Est. average glucose Bld gHb Est-mCnc: 117 mg/dL
Hgb A1c MFr Bld: 5.7 % — ABNORMAL HIGH (ref 4.8–5.6)

## 2021-10-05 LAB — TSH: TSH: 0.858 u[IU]/mL (ref 0.450–4.500)

## 2021-10-05 LAB — MAGNESIUM: Magnesium: 2 mg/dL (ref 1.6–2.3)

## 2021-11-12 ENCOUNTER — Ambulatory Visit: Payer: Commercial Managed Care - PPO | Admitting: Family Medicine

## 2021-11-13 ENCOUNTER — Encounter: Payer: Self-pay | Admitting: *Deleted

## 2021-11-19 ENCOUNTER — Encounter: Payer: Self-pay | Admitting: Internal Medicine

## 2021-11-19 ENCOUNTER — Encounter: Payer: Self-pay | Admitting: Family Medicine

## 2021-11-19 ENCOUNTER — Ambulatory Visit (INDEPENDENT_AMBULATORY_CARE_PROVIDER_SITE_OTHER): Payer: Commercial Managed Care - PPO | Admitting: Family Medicine

## 2021-11-19 VITALS — BP 114/70 | HR 86 | Ht <= 58 in | Wt 230.6 lb

## 2021-11-19 DIAGNOSIS — Z1211 Encounter for screening for malignant neoplasm of colon: Secondary | ICD-10-CM

## 2021-11-19 DIAGNOSIS — Z7689 Persons encountering health services in other specified circumstances: Secondary | ICD-10-CM

## 2021-11-19 NOTE — Assessment & Plan Note (Signed)
Physical exam performed ?Referral to GI for colonoscopy ?Defer Hep C screening and mammogram at this time ? ?

## 2021-11-19 NOTE — Patient Instructions (Signed)
I appreciate the opportunity to provide care to you today! ?  ?Follow up: 3 months ? ?Referrals today-  to GI for colonoscopy ?  ?Please continue to a heart-healthy diet and exercise at least 36min daily ? ? ?  ?It was a pleasure to see you and I look forward to continuing to work together on your health and well-being. ?Please do not hesitate to call the office if you need care or have questions about your care. ?  ?Have a wonderful day and week. ?With Gratitude, ?Alvira Monday MSN, FNP-BC ? ?

## 2021-11-19 NOTE — Progress Notes (Signed)
? ?New Patient Office Visit ? ?Subjective:  ?Patient ID: [name], female    DOB: 07-16-1971  Age: 51 y.o. MRN: [id number] ? ?CC:  ?Chief Complaint  ?Patient presents with  ? New Patient (Initial Visit)  ?  Want to establish care, recommended by cardiology to find a PCP.   ? ? ?HPI ?[name] is a 51 y.o. female with past medical history of chronic systolic heart failure, hot flashes, obesity presents for establishing care.  ? -No complaints or concersn ?-Went into heat failure since covid ?-Sees a cardiologist and pulmonologist ?-Had recent labs in feburary ? ? ? ?Past Medical History:  ?Diagnosis Date  ? Anemia   ? Hernia, inguinal   ? Medical history non-contributory   ? Ventral hernia 10/02/2015  ? ? ?Past Surgical History:  ?Procedure Laterality Date  ? ABDOMINAL HYSTERECTOMY    ? CESAREAN SECTION    ? x 2  ? ESOPHAGOGASTRODUODENOSCOPY N/A 11/28/2013  ? Procedure: ESOPHAGOGASTRODUODENOSCOPY (EGD);  Surgeon: Corbin Ade, MD;  [facility];  Service: Endoscopy;  Laterality: N/A;  12:15  ? EYE SURGERY    ? INCISIONAL HERNIA REPAIR N/A 11/24/2015  ? Procedure: INCISIONAL HERNIORRHAPHY WITH MESH;  Surgeon: Franky Macho, MD;  Location: [facility];  Service: General;  Laterality: N/A;  ? INSERTION OF MESH N/A 11/24/2015  ? Procedure: INSERTION OF MESH;  Surgeon: Franky Macho, MD;  Location: [facility];  Service: General;  Laterality: N/A;  ? RIGHT/LEFT HEART CATH AND CORONARY ANGIOGRAPHY N/A 01/13/2021  ? Procedure: RIGHT/LEFT HEART CATH AND CORONARY ANGIOGRAPHY;  Surgeon: Corky Crafts, MD;  Location: [facility] INVASIVE CV LAB;  Service: Cardiovascular;  Laterality: N/A;  ? TONSILLECTOMY    ? ? ?Family History  ?Problem Relation Age of Onset  ? Cancer Mother   ?     lung  ? Other Father   ?     was shot and killed  ? Hyperlipidemia Sister   ? Dementia Maternal Grandmother   ? Heart attack Maternal Grandfather   ? Colon cancer Neg Hx   ? ? ?Social History  ? ?Socioeconomic History  ? Marital status:  Single  ?  Spouse name: Not on file  ? Number of children: Not on file  ? Years of education: Not on file  ? Highest education level: Not on file  ?Occupational History  ? Occupation: [employer]  ?  Employer: [employer]  ?Tobacco Use  ? Smoking status: Never  ? Smokeless tobacco: Never  ?Vaping Use  ? Vaping Use: Never used  ?Substance and Sexual Activity  ? Alcohol use: Yes  ?  Comment: rarely  ? Drug use: No  ? Sexual activity: Not Currently  ?  Birth control/protection: Surgical  ?  Comment: hyst  ?Other Topics Concern  ? Not on file  ?Social History Narrative  ? Not on file  ? ?Social Determinants of Health  ? ?Financial Resource Strain: Low Risk   ? Difficulty of Paying Living Expenses: Not hard at all  ?Food Insecurity: No Food Insecurity  ? Worried About Programme researcher, broadcasting/film/video in the Last Year: Never true  ? Ran Out of Food in the Last Year: Never true  ?Transportation Needs: No Transportation Needs  ? Lack of Transportation (Medical): No  ? Lack of Transportation (Non-Medical): No  ?Physical Activity: Insufficiently Active  ? Days of Exercise per Week: 3 days  ? Minutes of Exercise per Session: 20 min  ?Stress:  Stress Concern Present  ? Feeling of Stress : To some extent  ?Social Connections: Moderately Integrated  ? Frequency of Communication with Friends and Family: More than three times a week  ? Frequency of Social Gatherings with Friends and Family: Once a week  ? Attends Religious Services: 1 to 4 times per year  ? Active Member of Clubs or Organizations: No  ? Attends Banker Meetings: Never  ? Marital Status: Married  ?Intimate Partner Violence: Not At Risk  ? Fear of Current or Ex-Partner: No  ? Emotionally Abused: No  ? Physically Abused: No  ? Sexually Abused: No  ? ? ?ROS ?Review of Systems  ?Constitutional:  Negative for chills, fatigue and fever.  ?HENT:  Negative for ear discharge, facial swelling and hearing loss.   ?Eyes:  Negative for photophobia, discharge  and itching.  ?Respiratory:  Negative for choking, chest tightness and shortness of breath.   ?Gastrointestinal:  Negative for constipation, diarrhea, nausea and vomiting.  ?Endocrine: Negative for polydipsia, polyphagia and polyuria.  ?Genitourinary:  Negative for frequency, pelvic pain and urgency.  ?Musculoskeletal:  Negative for back pain, gait problem and neck pain.  ?Skin:  Negative for pallor and rash.  ?Neurological:  Negative for dizziness, seizures and speech difficulty.  ?Hematological:  Does not bruise/bleed easily.  ?Psychiatric/Behavioral:  Negative for hallucinations. The patient is not nervous/anxious.   ? ?Objective:  ? ?Today's Vitals: BP 114/70 (BP Location: Left Arm, Patient Position: Sitting)   Pulse 86   Ht 4\' 7"  (1.397 m)   Wt 230 lb 9.6 oz (104.6 kg)   SpO2 98%   BMI 53.60 kg/m?  ? ?Physical Exam ?Constitutional:   ?   Appearance: Normal appearance.  ?HENT:  ?   Head: Normocephalic.  ?   Right Ear: External ear normal.  ?   Left Ear: External ear normal.  ?   Nose: Nose normal.  ?   Mouth/Throat:  ?   Mouth: Mucous membranes are moist.  ?Eyes:  ?   General:     ?   Right eye: No discharge.     ?   Left eye: No discharge.  ?   Extraocular Movements: Extraocular movements intact.  ?   Pupils: Pupils are equal, round, and reactive to light.  ?Cardiovascular:  ?   Rate and Rhythm: Normal rate and regular rhythm.  ?   Pulses: Normal pulses.  ?   Heart sounds: Normal heart sounds.  ?Pulmonary:  ?   Effort: Pulmonary effort is normal.  ?   Breath sounds: Normal breath sounds. No rhonchi.  ?Abdominal:  ?   General: Abdomen is flat. Bowel sounds are normal.  ?   Palpations: Abdomen is soft.  ?Musculoskeletal:     ?   General: Normal range of motion.  ?   Cervical back: Normal range of motion. No tenderness.  ?   Right lower leg: No edema.  ?   Left lower leg: No edema.  ?Skin: ?   Capillary Refill: Capillary refill takes less than 2 seconds.  ?   Findings: No lesion or rash.  ?Neurological:  ?    Mental Status: She is alert and oriented to person, place, and time.  ?Psychiatric:  ?   Comments: Normal affect  ? ? ?Assessment & Plan:  ? ?Problem List Items Addressed This Visit   ? ?  ? Other  ? Encounter to establish care  ?  Physical exam performed ?Referral to GI for colonoscopy ?  Defer Hep C screening and mammogram at this time ? ?  ?  ? ?Other Visit Diagnoses   ? ? Colon cancer screening    -  Primary  ? Relevant Orders  ? Ambulatory referral to Gastroenterology  ? ?  ? ? ?Outpatient Encounter Medications as of 11/19/2021  ?Medication Sig  ? Biotin w/ Vitamins C & E (HAIR SKIN & NAILS GUMMIES PO) Take 2 each by mouth daily.  ? furosemide (LASIX) 20 MG tablet Take 2 tablets (40 mg total) by mouth daily.  ? levocetirizine (XYZAL) 5 MG tablet Take 5 mg by mouth every evening.  ? metoprolol succinate (TOPROL XL) 25 MG 24 hr tablet Take 0.5 tablets (12.5 mg total) by mouth daily.  ? potassium chloride SA (KLOR-CON M20) 20 MEQ tablet Take 2 tablets (40 mEq total) by mouth daily.  ? vitamin C (ASCORBIC ACID) 500 MG tablet   ? Zinc 50 MG TABS   ? Multiple Vitamins-Minerals (MEGA MULTIVITAMIN FOR WOMEN) TABS Take by mouth.  ? ?No facility-administered encounter medications on file as of 11/19/2021.  ? ? ?Follow-up: Return in about 3 months (around 02/18/2022).  ? ?Gilmore Laroche, FNP ?

## 2022-01-12 ENCOUNTER — Encounter: Payer: Self-pay | Admitting: *Deleted

## 2022-01-27 ENCOUNTER — Ambulatory Visit: Payer: Commercial Managed Care - PPO

## 2022-02-18 ENCOUNTER — Ambulatory Visit (INDEPENDENT_AMBULATORY_CARE_PROVIDER_SITE_OTHER): Payer: Commercial Managed Care - PPO | Admitting: Family Medicine

## 2022-02-18 ENCOUNTER — Encounter: Payer: Self-pay | Admitting: Family Medicine

## 2022-02-18 VITALS — BP 128/77 | HR 66 | Ht <= 58 in | Wt 225.0 lb

## 2022-02-18 DIAGNOSIS — Z713 Dietary counseling and surveillance: Secondary | ICD-10-CM | POA: Diagnosis not present

## 2022-02-18 NOTE — Progress Notes (Signed)
Established Patient Office Visit  Subjective:  Patient ID: [name], female    DOB: Jan 08, 1971  Age: 51 y.o. MRN: [id number]  CC:  Chief Complaint  Patient presents with   Follow-up    3 m follow up, states she was scheduled for colonoscopy but appt was cancelled by the office, would like to try mounjaro for weight loss.     HPI [name] is a 51 y.o. female with past medical history of obesity presents for f/u of  chronic medical conditions.  Wt loss: Reports increased weight gain and inability to lose weight. She notes decreasing her calorie intake and admits to exercising minimally because she works two jobs. She reports reducing her intake of fatty foods and cooking at home.   Past Medical History:  Diagnosis Date   Anemia    Hernia, inguinal    Medical history non-contributory    Ventral hernia 10/02/2015    Past Surgical History:  Procedure Laterality Date   ABDOMINAL HYSTERECTOMY     CESAREAN SECTION     x 2   ESOPHAGOGASTRODUODENOSCOPY N/A 11/28/2013   Procedure: ESOPHAGOGASTRODUODENOSCOPY (EGD);  Surgeon: Daneil Dolin, MD;  [facility];  Service: Endoscopy;  Laterality: N/A;  12:15   EYE SURGERY     INCISIONAL HERNIA REPAIR N/A 11/24/2015   Procedure: Fatima Blank HERNIORRHAPHY WITH MESH;  Surgeon: Aviva Signs, MD;  Location: [facility];  Service: General;  Laterality: N/A;   INSERTION OF MESH N/A 11/24/2015   Procedure: INSERTION OF MESH;  Surgeon: Aviva Signs, MD;  Location: [facility];  Service: General;  Laterality: N/A;   RIGHT/LEFT HEART CATH AND CORONARY ANGIOGRAPHY N/A 01/13/2021   Procedure: RIGHT/LEFT HEART CATH AND CORONARY ANGIOGRAPHY;  Surgeon: Jettie Booze, MD;  Location: [place] CV LAB;  Service: Cardiovascular;  Laterality: N/A;   TONSILLECTOMY      Family History  Problem Relation Age of Onset   Cancer Mother        lung   Other Father        was shot and killed   Hyperlipidemia Sister    Dementia Maternal  Grandmother    Heart attack Maternal Grandfather    Colon cancer Neg Hx     Social History   Socioeconomic History   Marital status: Single    Spouse name: Not on file   Number of children: Not on file   Years of education: Not on file   Highest education level: Not on file  Occupational History   Occupation: [place]    Employer: [employer]  Tobacco Use   Smoking status: Never   Smokeless tobacco: Never  Vaping Use   Vaping Use: Never used  Substance and Sexual Activity   Alcohol use: Yes    Comment: rarely   Drug use: No   Sexual activity: Not Currently    Birth control/protection: Surgical    Comment: hyst  Other Topics Concern   Not on file  Social History Narrative   Not on file   Social Determinants of Health   Financial Resource Strain: Low Risk  (03/11/2021)   Overall Financial Resource Strain (CARDIA)    Difficulty of Paying Living Expenses: Not hard at all  Food Insecurity: No Food Insecurity (03/11/2021)   Hunger Vital Sign    Worried About Running Out of Food in the Last Year: Never true    [place] in the Last Year: Never true  Transportation  Needs: No Transportation Needs (03/11/2021)   PRAPARE - Hydrologist (Medical): No    Lack of Transportation (Non-Medical): No  Physical Activity: Insufficiently Active (03/11/2021)   Exercise Vital Sign    Days of Exercise per Week: 3 days    Minutes of Exercise per Session: 20 min  Stress: Stress Concern Present (03/11/2021)   [place]    Feeling of Stress : To some extent  Social Connections: Moderately Integrated (03/11/2021)   Social Connection and Isolation Panel [NHANES]    Frequency of Communication with Friends and Family: More than three times a week    Frequency of Social Gatherings with Friends and Family: Once a week    Attends Religious Services: 1 to 4 times per year    Active  Member of [employer] or Organizations: No    Attends Archivist Meetings: Never    Marital Status: Married  Human resources officer Violence: Not At Risk (03/11/2021)   Humiliation, Afraid, Rape, and Kick questionnaire    Fear of Current or Ex-Partner: No    Emotionally Abused: No    Physically Abused: No    Sexually Abused: No    Outpatient Medications Prior to Visit  Medication Sig Dispense Refill   Biotin w/ Vitamins C & E (HAIR SKIN & NAILS GUMMIES PO) Take 2 each by mouth daily.     furosemide (LASIX) 20 MG tablet Take 2 tablets (40 mg total) by mouth daily. 180 tablet 3   levocetirizine (XYZAL) 5 MG tablet Take 5 mg by mouth every evening.     metoprolol succinate (TOPROL XL) 25 MG 24 hr tablet Take 0.5 tablets (12.5 mg total) by mouth daily. 45 tablet 2   potassium chloride SA (KLOR-CON M20) 20 MEQ tablet Take 2 tablets (40 mEq total) by mouth daily. 180 tablet 3   vitamin C (ASCORBIC ACID) 500 MG tablet      Zinc 50 MG TABS      Multiple Vitamins-Minerals (MEGA MULTIVITAMIN FOR WOMEN) TABS Take by mouth.     No facility-administered medications prior to visit.    Allergies  Allergen Reactions   Other     Peaches- hives    ROS Review of Systems  Constitutional:  Negative for fatigue and fever.  Eyes:  Negative for pain and redness.  Respiratory:  Negative for chest tightness and shortness of breath.   Gastrointestinal:  Negative for diarrhea, nausea and vomiting.  Endocrine: Negative for polyphagia.      Objective:    Physical Exam HENT:     Head: Normocephalic.  Cardiovascular:     Rate and Rhythm: Normal rate and regular rhythm.     Pulses: Normal pulses.     Heart sounds: Normal heart sounds.  Pulmonary:     Effort: Pulmonary effort is normal.     Breath sounds: Normal breath sounds.  Abdominal:     Palpations: Abdomen is soft.  Musculoskeletal:     Cervical back: No rigidity.  Neurological:     Mental Status: She is alert.     BP 128/77   Pulse 66    Ht 4' 7"  (1.397 m)   Wt 225 lb (102.1 kg)   SpO2 95%   BMI 52.29 kg/m  Wt Readings from Last 3 Encounters:  02/18/22 225 lb (102.1 kg)  11/19/21 230 lb 9.6 oz (104.6 kg)  09/29/21 227 lb 3.2 oz (103.1 kg)    Lab Results  Component Value Date   TSH 0.858 10/02/2021   Lab Results  Component Value Date   WBC 7.4 10/02/2021   HGB 11.9 10/02/2021   HCT 35.0 10/02/2021   MCV 87 10/02/2021   PLT 300 10/02/2021   Lab Results  Component Value Date   NA 141 10/02/2021   K 4.4 10/02/2021   CO2 23 10/02/2021   GLUCOSE 99 10/02/2021   BUN 10 10/02/2021   CREATININE 0.92 10/02/2021   BILITOT 0.3 10/02/2021   ALKPHOS 69 10/02/2021   AST 16 10/02/2021   ALT 12 10/02/2021   PROT 7.6 10/02/2021   ALBUMIN 4.5 10/02/2021   CALCIUM 9.4 10/02/2021   ANIONGAP 7 01/12/2021   EGFR 77 10/02/2021   Lab Results  Component Value Date   CHOL 218 (H) 10/02/2021   Lab Results  Component Value Date   HDL 56 10/02/2021   Lab Results  Component Value Date   LDLCALC 151 (H) 10/02/2021   Lab Results  Component Value Date   TRIG 63 10/02/2021   Lab Results  Component Value Date   CHOLHDL 3.9 10/02/2021   Lab Results  Component Value Date   HGBA1C 5.7 (H) 10/02/2021      Assessment & Plan:   Problem List Items Addressed This Visit       Other   Weight loss counseling, encounter for - Primary    Recommended lifestyle changes including:Cutting out all diet beverages and drink only water Cut out junk food, fast food and processed foods Exercise 150 minutes a week Will reconvene in 1 month  If needed, start wgvoy       No orders of the defined types were placed in this encounter.   Follow-up: Return in about 1 month (around 03/21/2022) for weight management.    Alvira Monday, FNP

## 2022-02-18 NOTE — Patient Instructions (Signed)
I appreciate the opportunity to provide care to you today!    Follow up:  1 months    Please continue to a heart-healthy diet and increase your physical activities. Try to exercise for 30mins at least three times a week.      It was a pleasure to see you and I look forward to continuing to work together on your health and well-being. Please do not hesitate to call the office if you need care or have questions about your care.   Have a wonderful day and week. With Gratitude, Kataleya Zaugg MSN, FNP-BC  

## 2022-02-18 NOTE — Progress Notes (Signed)
fol

## 2022-02-18 NOTE — Assessment & Plan Note (Signed)
Recommended lifestyle changes including:Cutting out all diet beverages and drink only water Cut out junk food, fast food and processed foods Exercise 150 minutes a week Will reconvene in 1 month  If needed, start wgvoy

## 2022-02-19 LAB — HM MAMMOGRAPHY

## 2022-03-17 ENCOUNTER — Encounter (INDEPENDENT_AMBULATORY_CARE_PROVIDER_SITE_OTHER): Payer: Self-pay

## 2022-03-22 ENCOUNTER — Telehealth: Payer: Commercial Managed Care - PPO | Admitting: Nurse Practitioner

## 2022-03-22 DIAGNOSIS — R051 Acute cough: Secondary | ICD-10-CM | POA: Diagnosis not present

## 2022-03-22 NOTE — Progress Notes (Signed)
We are sorry that you are not feeling well.  Here is how we plan to help!  Please be sure you have taken a COVID test since the start of your symptoms. Your questionnaire reflects that your last COVID test was over a week ago.   If you test positive for COVID we recommend scheduling a follow up video visit for evaluation.   Based on your presentation I believe you most likely have A cough due to a virus.  This is called viral bronchitis and is best treated by rest, plenty of fluids and control of the cough.  You may use Ibuprofen or Tylenol as directed to help your symptoms.     In addition you may use Mucinex over the counter to help loosen up the mucous and cough it up.   From your responses in the eVisit questionnaire you describe inflammation in the upper respiratory tract which is causing a significant cough.    Viruses are treated by controlling symptoms or eliminating the cause.      HOME CARE Only take medications as instructed by your medical team. Complete the entire course of an antibiotic. Drink plenty of fluids and get plenty of rest. Avoid close contacts especially the very young and the elderly Cover your mouth if you cough or cough into your sleeve. Always remember to wash your hands A steam or ultrasonic humidifier can help congestion.   GET HELP RIGHT AWAY IF: You develop worsening fever. You become short of breath You cough up blood. Your symptoms persist after you have completed your treatment plan MAKE SURE YOU  Understand these instructions. Will watch your condition. Will get help right away if you are not doing well or get worse.    Thank you for choosing an e-visit.  Your e-visit answers were reviewed by a board certified advanced clinical practitioner to complete your personal care plan. Depending upon the condition, your plan could have included both over the counter or prescription medications.  Please review your pharmacy choice. Make sure the  pharmacy is open so you can pick up prescription now. If there is a problem, you may contact your provider through [employer] and have the prescription routed to another pharmacy.  Your safety is important to [place]. If you have drug allergies check your prescription carefully.   For the next 24 hours you can use MyChart to ask questions about today's visit, request a non-urgent call back, or ask for a work or school excuse. You will get an email in the next two days asking about your experience. I hope that your e-visit has been valuable and will speed your recovery.   I spent approximately 5 minutes reviewing the patient's history, current symptoms and coordinating their plan of care today.

## 2022-04-03 ENCOUNTER — Other Ambulatory Visit: Payer: Self-pay | Admitting: Cardiology

## 2022-04-06 ENCOUNTER — Encounter: Payer: Self-pay | Admitting: Family Medicine

## 2022-04-06 ENCOUNTER — Encounter: Payer: Self-pay | Admitting: Cardiology

## 2022-04-06 ENCOUNTER — Ambulatory Visit (INDEPENDENT_AMBULATORY_CARE_PROVIDER_SITE_OTHER): Payer: Commercial Managed Care - PPO | Admitting: Family Medicine

## 2022-04-06 ENCOUNTER — Ambulatory Visit: Payer: Commercial Managed Care - PPO | Attending: Cardiology | Admitting: Cardiology

## 2022-04-06 VITALS — BP 125/75 | HR 76 | Ht <= 58 in | Wt 227.0 lb

## 2022-04-06 DIAGNOSIS — I447 Left bundle-branch block, unspecified: Secondary | ICD-10-CM

## 2022-04-06 DIAGNOSIS — Z713 Dietary counseling and surveillance: Secondary | ICD-10-CM | POA: Diagnosis not present

## 2022-04-06 DIAGNOSIS — I5022 Chronic systolic (congestive) heart failure: Secondary | ICD-10-CM

## 2022-04-06 MED ORDER — WEGOVY 0.25 MG/0.5ML ~~LOC~~ SOAJ: 0.2500 mg | SUBCUTANEOUS | 0 refills | Status: DC

## 2022-04-06 NOTE — Assessment & Plan Note (Signed)
Pt has gained 2 lbs since the last visit She has been implementing lifestyle changes but cannot lose weight with lifestyle changes alone She denies hx of thyroid tumor and pancreatitis Will start pt on Wegovy today

## 2022-04-06 NOTE — Patient Instructions (Addendum)
I appreciate the opportunity to provide care to you today!    Follow up:  1 months   A prescription for wegvoy has been sent to your pharmacy   Please continue to a heart-healthy diet and increase your physical activities. Try to exercise for at least three times a week.      It was a pleasure to see you and I look forward to continuing to work together on your health and well-being. Please do not hesitate to call the office if you need care or have questions about your care.   Have a wonderful day and week. With Gratitude, Gilmore Laroche MSN, FNP-BC

## 2022-04-06 NOTE — Progress Notes (Signed)
mammogram

## 2022-04-06 NOTE — Progress Notes (Signed)
Clinical Summary [name] is a 51 y.o.female seen today for follow up of the following medical problems.      1. Chronic sysotlic HF - 11/2020 echo LVEF 40-45%, normal diastolic fuxn, normal RV function, severe pulm HTN PASP 72 -BNP 36      01/2021 RHC/LHC: no significant CAD. Mean PA 24, PCWP 14, CI 4.5 01/2021 echo: LVEF 50-55%, normal RV function.      - issues with low bp's, entresto was stopped and lasix was decreased    - No SOB/DOE, occasional LE edema - home weights 224 lbs and stable - compliant with meds       2. LBBB - unknown chronicity, new compared to 2015 EKG>      3. 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    Past Medical History:  Diagnosis Date   Anemia    Hernia, inguinal    Medical history non-contributory    Ventral hernia 10/02/2015     Allergies  Allergen Reactions   Other     Peaches- hives     Current Outpatient Medications  Medication Sig Dispense Refill   Biotin w/ Vitamins C & E (HAIR SKIN & NAILS GUMMIES PO) Take 2 each by mouth daily.     furosemide (LASIX) 20 MG tablet Take 2 tablets (40 mg total) by mouth daily. 180 tablet 3   levocetirizine (XYZAL) 5 MG tablet Take 5 mg by mouth every evening.     metoprolol succinate (TOPROL-XL) 25 MG 24 hr tablet TAKE 1/2 TABLET BY MOUTH ONCE DAILY 45 tablet 2   Multiple Vitamins-Minerals (MEGA MULTIVITAMIN FOR WOMEN) TABS Take by mouth.     potassium chloride SA (KLOR-CON M20) 20 MEQ tablet Take 2 tablets (40 mEq total) by mouth daily. 180 tablet 3   vitamin C (ASCORBIC ACID) 500 MG tablet      Zinc 50 MG TABS      No current facility-administered medications for this visit.     Past Surgical History:  Procedure Laterality Date   ABDOMINAL HYSTERECTOMY      CESAREAN SECTION     x 2   ESOPHAGOGASTRODUODENOSCOPY N/A 11/28/2013   Procedure: ESOPHAGOGASTRODUODENOSCOPY (EGD);  Surgeon: Corbin Ade, MD;  [facility];  Service: Endoscopy;  Laterality: N/A;  12:15   EYE SURGERY     INCISIONAL HERNIA REPAIR N/A 11/24/2015   Procedure: Sherald Hess HERNIORRHAPHY WITH MESH;  Surgeon: Franky Macho, MD;  Location: [facility];  Service: General;  Laterality: N/A;   INSERTION OF MESH N/A 11/24/2015   Procedure: INSERTION OF MESH;  Surgeon: Franky Macho, MD;  Location: [facility];  Service: General;  Laterality: N/A;   RIGHT/LEFT HEART CATH AND CORONARY ANGIOGRAPHY N/A 01/13/2021   Procedure: RIGHT/LEFT HEART CATH AND CORONARY ANGIOGRAPHY;  Surgeon: Corky Crafts, MD;  Location: [facility] INVASIVE CV LAB;  Service: Cardiovascular;  Laterality: N/A;   TONSILLECTOMY       Allergies  Allergen Reactions   Other     Peaches- hives      Family History  Problem Relation Age of Onset   Cancer Mother        lung   Other Father  was shot and killed   Hyperlipidemia Sister    Dementia Maternal Grandmother    Heart attack Maternal Grandfather    Colon cancer Neg Hx      Social History Ms. Scimone reports that she has never smoked. She has never used smokeless tobacco. Ms. Debock reports current alcohol use.   Review of Systems CONSTITUTIONAL: No weight loss, fever, chills, weakness or fatigue.  HEENT: Eyes: No visual loss, blurred vision, double vision or yellow sclerae.No hearing loss, sneezing, congestion, runny nose or sore throat.  SKIN: No rash or itching.  CARDIOVASCULAR: per hpi RESPIRATORY: No shortness of breath, cough or sputum.  GASTROINTESTINAL: No anorexia, nausea, vomiting or diarrhea. No abdominal pain or blood.  GENITOURINARY: No burning on urination, no polyuria NEUROLOGICAL: No headache, dizziness, syncope, paralysis, ataxia, numbness or tingling in the extremities. No change in bowel or bladder control.  MUSCULOSKELETAL: No  muscle, back pain, joint pain or stiffness.  LYMPHATICS: No enlarged nodes. No history of splenectomy.  PSYCHIATRIC: No history of depression or anxiety.  ENDOCRINOLOGIC: No reports of sweating, cold or heat intolerance. No polyuria or polydipsia.  Marland Kitchen   Physical Examination Today's Vitals   04/06/22 0838  BP: (!) 140/90  Pulse: 76  SpO2: 97%  Weight: 227 lb (103 kg)  Height: 4\' 7"  (1.397 m)   Body mass index is 52.76 kg/m.  Gen: resting comfortably, no acute distress HEENT: no scleral icterus, pupils equal round and reactive, no palptable cervical adenopathy,  CV: RRR, no mr/g, no jvd Resp: Clear to auscultation bilaterally GI: abdomen is soft, non-tender, non-distended, normal bowel sounds, no hepatosplenomegaly MSK: extremities are warm, no edema.  Skin: warm, no rash Neuro:  no focal deficits Psych: appropriate affect   Diagnostic Studies  11/2020 echo IMPRESSIONS     1. Left ventricular ejection fraction, by estimation, is 40 to 45%. The  left ventricle has mildly decreased function. The left ventricle  demonstrates global hypokinesis with paradoxical septal motion suggestive  of LBBB. There is mild left ventricular  hypertrophy. Left ventricular diastolic parameters were normal.   2. Right ventricular systolic function is normal. The right ventricular  size is normal. There is severely elevated pulmonary artery systolic  pressure. The estimated right ventricular systolic pressure is 0000000 mmHg  (TR Doppler jet might be contaminated  by LVOT flow).   3. Left atrial size was moderately dilated.   4. The mitral valve is grossly normal. Moderate mitral valve  regurgitation, posteriorly directed.   5. The aortic valve is tricuspid. Aortic valve regurgitation is not  visualized.   6. The inferior vena cava is normal in size with greater than 50%  respiratory variability, suggesting right atrial pressure of 3 mmHg.   01/2021 RHC/LHC There is moderate to severe left  ventricular systolic dysfunction. The left ventricular ejection fraction is 25-35% by visual estimate. LV end diastolic pressure is normal. There is no aortic valve stenosis. Ao sat 100%, PA sat 81%, mean PA pressure 24 mm Hg; mean PCWP 14 mm Hg; CO 8.3 L/min; CI 4.5. Upper normal PA pressure. No angiographically apparent coronary artery disease.   Medical therapy for nonischemic cardiomyopathy.   Assessment and Plan  1. Chronic systolic HF - new diagnosis based on 11/2020 echo, LVEF 40-45% - LVEF has since normalized - cath without significant coronary disease - medical therapy limited by low bp's, had to d/c entresto.   - no symptoms, continue current meds   2. Pulmonary HTN - severe by echo, normal RV  function - RHC showed high normal PA pressure, this was after significant diuresis - pulm HTN was secondary to left sided heart disease that has since imrpvoed with resolution of pulm HTN.   - continue to monitor     3. Chronic LBBB - no evidence of CAD by cath - EKG today shows SR, chronic LBBB    Antoine Poche, M.D.

## 2022-04-06 NOTE — Patient Instructions (Signed)
Medication Instructions:  Your physician recommends that you continue on your current medications as directed. Please refer to the Current Medication list given to you today.   Labwork: none  Testing/Procedures: none  Follow-Up:  Your physician recommends that you schedule a follow-up appointment in: 1 year   Any Other Special Instructions Will Be Listed Below (If Applicable).  You will receive a call in about 10 months reminding you to schedule your appointment. If you do not receive this call, please contact our office.  If you need a refill on your cardiac medications before your next appointment, please call your pharmacy.  

## 2022-04-06 NOTE — Progress Notes (Signed)
Established Patient Office Visit  Subjective:  Patient ID: [name], female    DOB: 24-Sep-1970  Age: 51 y.o. MRN: [id number]  CC:  Chief Complaint  Patient presents with   Follow-up    1 month f/u, no health concerns.     HPI [name] is a 51 y.o. female with past medical history of Obesity presents for f/u of  chronic medical conditions. Wt loss: pt has gained 2 lbs since the last visit. She has been implementing lifestyle changes but cannot lose weight with lifestyle changes alone.   Past Medical History:  Diagnosis Date   Anemia    Hernia, inguinal    Medical history non-contributory    Ventral hernia 10/02/2015    Past Surgical History:  Procedure Laterality Date   ABDOMINAL HYSTERECTOMY     CESAREAN SECTION     x 2   ESOPHAGOGASTRODUODENOSCOPY N/A 11/28/2013   Procedure: ESOPHAGOGASTRODUODENOSCOPY (EGD);  Surgeon: Daneil Dolin, MD;  [facility];  Service: Endoscopy;  Laterality: N/A;  12:15   EYE SURGERY     INCISIONAL HERNIA REPAIR N/A 11/24/2015   Procedure: Fatima Blank HERNIORRHAPHY WITH MESH;  Surgeon: Aviva Signs, MD;  Location: [facility];  Service: General;  Laterality: N/A;   INSERTION OF MESH N/A 11/24/2015   Procedure: INSERTION OF MESH;  Surgeon: Aviva Signs, MD;  Location: [facility];  Service: General;  Laterality: N/A;   RIGHT/LEFT HEART CATH AND CORONARY ANGIOGRAPHY N/A 01/13/2021   Procedure: RIGHT/LEFT HEART CATH AND CORONARY ANGIOGRAPHY;  Surgeon: Jettie Booze, MD;  Location: [facility];  Service: Cardiovascular;  Laterality: N/A;   TONSILLECTOMY      Family History  Problem Relation Age of Onset   Cancer Mother        lung   Other Father        was shot and killed   Hyperlipidemia Sister    Dementia Maternal Grandmother    Heart attack Maternal Grandfather    Colon cancer Neg Hx     Social History   Socioeconomic History   Marital status: Single    Spouse name: Not on file   Number of children: Not  on file   Years of education: Not on file   Highest education level: Not on file  Occupational History   Occupation: [place]    Employer: [employer]  Tobacco Use   Smoking status: Never   Smokeless tobacco: Never  Vaping Use   Vaping Use: Never used  Substance and Sexual Activity   Alcohol use: Yes    Comment: rarely   Drug use: No   Sexual activity: Not Currently    Birth control/protection: Surgical    Comment: hyst  Other Topics Concern   Not on file  Social History Narrative   Not on file   Social Determinants of Health   Financial Resource Strain: Low Risk  (03/11/2021)   Overall Financial Resource Strain (CARDIA)    Difficulty of Paying Living Expenses: Not hard at all  Food Insecurity: No Food Insecurity (03/11/2021)   Hunger Vital Sign    Worried About Running Out of Food in the Last Year: Never true    [place] in the Last Year: Never true  Transportation Needs: No Transportation Needs (03/11/2021)   PRAPARE - Hydrologist (Medical): No    Lack of Transportation (Non-Medical): No  Physical Activity: Insufficiently Active (03/11/2021)  Exercise Vital Sign    Days of Exercise per Week: 3 days    Minutes of Exercise per Session: 20 min  Stress: Stress Concern Present (03/11/2021)   [place]    Feeling of Stress : To some extent  Social Connections: Moderately Integrated (03/11/2021)   Social Connection and Isolation Panel [NHANES]    Frequency of Communication with Friends and Family: More than three times a week    Frequency of Social Gatherings with Friends and Family: Once a week    Attends Religious Services: 1 to 4 times per year    Active Member of [employer] or Organizations: No    Attends Archivist Meetings: Never    Marital Status: Married  Human resources officer Violence: Not At Risk (03/11/2021)   Humiliation, Afraid, Rape, and Kick  questionnaire    Fear of Current or Ex-Partner: No    Emotionally Abused: No    Physically Abused: No    Sexually Abused: No    Outpatient Medications Prior to Visit  Medication Sig Dispense Refill   Biotin w/ Vitamins C & E (HAIR SKIN & NAILS GUMMIES PO) Take 2 each by mouth daily.     furosemide (LASIX) 20 MG tablet Take 2 tablets (40 mg total) by mouth daily. 180 tablet 3   levocetirizine (XYZAL) 5 MG tablet Take 5 mg by mouth every evening.     metoprolol succinate (TOPROL-XL) 25 MG 24 hr tablet TAKE 1/2 TABLET BY MOUTH ONCE DAILY 45 tablet 2   potassium chloride SA (KLOR-CON M20) 20 MEQ tablet Take 2 tablets (40 mEq total) by mouth daily. 180 tablet 3   vitamin C (ASCORBIC ACID) 500 MG tablet      Zinc 50 MG TABS      Multiple Vitamins-Minerals (MEGA MULTIVITAMIN FOR WOMEN) TABS Take by mouth. (Patient not taking: Reported on 04/06/2022)     No facility-administered medications prior to visit.    Allergies  Allergen Reactions   Other     Peaches- hives    ROS Review of Systems  Constitutional:  Negative for fever.  Cardiovascular:  Negative for chest pain and palpitations.  Neurological:  Negative for dizziness and headaches.  Psychiatric/Behavioral:  Negative for self-injury and suicidal ideas.       Objective:    Physical Exam Constitutional:      Appearance: She is obese.  HENT:     Head: Normocephalic.  Cardiovascular:     Rate and Rhythm: Normal rate and regular rhythm.     Pulses: Normal pulses.     Heart sounds: Normal heart sounds.  Pulmonary:     Effort: Pulmonary effort is normal.     Breath sounds: Normal breath sounds.     BP 118/73   Pulse 67   Ht _0  (1.397 m)   Wt 227 lb 0.6 oz (103 kg)   SpO2 96%   BMI 52.77 kg/m  Wt Readings from Last 3 Encounters:  04/06/22 227 lb 0.6 oz (103 kg)  04/06/22 227 lb (103 kg)  02/18/22 225 lb (102.1 kg)    Lab Results  Component Value Date   TSH 0.858 10/02/2021   Lab Results  Component Value  Date   WBC 7.4 10/02/2021   HGB 11.9 10/02/2021   HCT 35.0 10/02/2021   MCV 87 10/02/2021   PLT 300 10/02/2021   Lab Results  Component Value Date   NA 141 10/02/2021   K 4.4 10/02/2021  CO2 23 10/02/2021   GLUCOSE 99 10/02/2021   BUN 10 10/02/2021   CREATININE 0.92 10/02/2021   BILITOT 0.3 10/02/2021   ALKPHOS 69 10/02/2021   AST 16 10/02/2021   ALT 12 10/02/2021   PROT 7.6 10/02/2021   ALBUMIN 4.5 10/02/2021   CALCIUM 9.4 10/02/2021   ANIONGAP 7 01/12/2021   EGFR 77 10/02/2021   Lab Results  Component Value Date   CHOL 218 (H) 10/02/2021   Lab Results  Component Value Date   HDL 56 10/02/2021   Lab Results  Component Value Date   LDLCALC 151 (H) 10/02/2021   Lab Results  Component Value Date   TRIG 63 10/02/2021   Lab Results  Component Value Date   CHOLHDL 3.9 10/02/2021   Lab Results  Component Value Date   HGBA1C 5.7 (H) 10/02/2021      Assessment & Plan:   Problem List Items Addressed This Visit       Other   Weight loss counseling, encounter for    Pt has gained 2 lbs since the last visit She has been implementing lifestyle changes but cannot lose weight with lifestyle changes alone She denies hx of thyroid tumor and pancreatitis Will start pt on Wegovy today      Other Visit Diagnoses     Morbid obesity ([place])    -  Primary   Relevant Medications   Semaglutide-Weight Management (WEGOVY) 0.25 MG/0.5ML SOAJ       Meds ordered this encounter  Medications   DISCONTD: Semaglutide-Weight Management (WEGOVY) 0.25 MG/0.5ML SOAJ    Sig: Inject 0.25 mg into the skin once a week for 28 days.    Dispense:  2 mL    Refill:  0   Semaglutide-Weight Management (WEGOVY) 0.25 MG/0.5ML SOAJ    Sig: Inject 0.25 mg into the skin once a week for 28 days.    Dispense:  2 mL    Refill:  0    Follow-up: Return in about 1 month (around 05/07/2022) for obesity.    Alvira Monday, FNP

## 2022-04-06 NOTE — Progress Notes (Signed)
documentation

## 2022-04-21 ENCOUNTER — Other Ambulatory Visit: Payer: Self-pay | Admitting: Family Medicine

## 2022-04-22 ENCOUNTER — Ambulatory Visit (INDEPENDENT_AMBULATORY_CARE_PROVIDER_SITE_OTHER): Payer: Commercial Managed Care - PPO | Admitting: Family Medicine

## 2022-04-22 ENCOUNTER — Encounter: Payer: Self-pay | Admitting: Family Medicine

## 2022-04-22 DIAGNOSIS — J019 Acute sinusitis, unspecified: Secondary | ICD-10-CM

## 2022-04-22 DIAGNOSIS — B9789 Other viral agents as the cause of diseases classified elsewhere: Secondary | ICD-10-CM | POA: Diagnosis not present

## 2022-04-22 MED ORDER — NOREL AD 4-10-325 MG PO TABS: ORAL_TABLET | ORAL | 0 refills | Status: DC

## 2022-04-22 NOTE — Progress Notes (Signed)
Virtual Visit via Telephone Note   This visit type was conducted due to national recommendations for restrictions regarding the COVID-19 Pandemic (e.g. social distancing) in an effort to limit this patient's exposure and mitigate transmission in our community.  Due to her co-morbid illnesses, this patient is at least at moderate risk for complications without adequate follow up.  This format is felt to be most appropriate for this patient at this time.  The patient did not have access to video technology/had technical difficulties with video requiring transitioning to audio format only (telephone).  All issues noted in this document were discussed and addressed.  No physical exam could be performed with this format.  Please refer to the patient's chart for her  consent to telehealth for [facility].   Evaluation Performed:  Follow-up visit  Date:  04/22/2022   ID:  [name], DOB 12-Dec-1970, MRN [id number]  Patient Location: Home Provider Location: Office/Clinic  Participants: Patient Location of Patient: Home Location of Provider: Telehealth Consent was obtain for visit to be over via telehealth. I verified that I am speaking with the correct person using two identifiers.  PCP:  Gilmore Laroche, FNP   Chief Complaint:  congestion  History of Present Illness:    [name] is a 51 y.o. female with c/o  nasal congestion, sinus pressure, sinus pain,  rhinorrhea, and sneezing. She denies cough, sore throat, body aches, fever, chills, SOB, and sick contacts.     The patient does not have symptoms concerning for COVID-19 infection (fever, chills, cough, or new shortness of breath).   Past Medical, Surgical, Social History, Allergies, and Medications have been Reviewed.  Past Medical History:  Diagnosis Date   Anemia    Hernia, inguinal    Medical history non-contributory    Ventral hernia 10/02/2015   Past Surgical History:  Procedure Laterality Date    ABDOMINAL HYSTERECTOMY     CESAREAN SECTION     x 2   ESOPHAGOGASTRODUODENOSCOPY N/A 11/28/2013   Procedure: ESOPHAGOGASTRODUODENOSCOPY (EGD);  Surgeon: Corbin Ade, MD;  [facility];  Service: Endoscopy;  Laterality: N/A;  12:15   EYE SURGERY     INCISIONAL HERNIA REPAIR N/A 11/24/2015   Procedure: Sherald Hess HERNIORRHAPHY WITH MESH;  Surgeon: Franky Macho, MD;  Location: [facility];  Service: General;  Laterality: N/A;   INSERTION OF MESH N/A 11/24/2015   Procedure: INSERTION OF MESH;  Surgeon: Franky Macho, MD;  Location: [facility];  Service: General;  Laterality: N/A;   RIGHT/LEFT HEART CATH AND CORONARY ANGIOGRAPHY N/A 01/13/2021   Procedure: RIGHT/LEFT HEART CATH AND CORONARY ANGIOGRAPHY;  Surgeon: Corky Crafts, MD;  Location: [facility] INVASIVE CV LAB;  Service: Cardiovascular;  Laterality: N/A;   TONSILLECTOMY       Current Meds  Medication Sig   Biotin w/ Vitamins C & E (HAIR SKIN & NAILS GUMMIES PO) Take 2 each by mouth daily.   Chlorphen-PE-Acetaminophen (NOREL AD) 4-10-325 MG TABS Take 1 tablet every 4 hours; Do not take more than 6 tables in 24 hours.   furosemide (LASIX) 20 MG tablet Take 2 tablets (40 mg total) by mouth daily.   levocetirizine (XYZAL) 5 MG tablet Take 5 mg by mouth every evening.   metoprolol succinate (TOPROL-XL) 25 MG 24 hr tablet TAKE 1/2 TABLET BY MOUTH ONCE DAILY   potassium chloride SA (KLOR-CON M20) 20 MEQ tablet Take 2 tablets (40 mEq total) by mouth daily.   vitamin C (ASCORBIC ACID) 500  MG tablet    Zinc 50 MG TABS      Allergies:   Other   ROS:   Please see the history of present illness.     All other systems reviewed and are negative.   Labs/Other Tests and Data Reviewed:    Recent Labs: 10/02/2021: ALT 12; BUN 10; Creatinine, Ser 0.92; Hemoglobin 11.9; Magnesium 2.0; Platelets 300; Potassium 4.4; Sodium 141; TSH 0.858   Recent Lipid Panel Lab Results  Component Value Date/Time   CHOL 218 (H) 10/02/2021 09:11 AM   TRIG 63  10/02/2021 09:11 AM   HDL 56 10/02/2021 09:11 AM   CHOLHDL 3.9 10/02/2021 09:11 AM   LDLCALC 151 (H) 10/02/2021 09:11 AM    Wt Readings from Last 3 Encounters:  04/06/22 227 lb 0.6 oz (103 kg)  04/06/22 227 lb (103 kg)  02/18/22 225 lb (102.1 kg)     Objective:    Vital Signs:  There were no vitals taken for this visit.     ASSESSMENT & PLAN:   Acute Viral Sinusitis Antibiotics are not warranted at the moment Will be treated symptomatically with Norel-AD Advised to take more than 6 tablets in 24 hours Advised to call on Monday if symptoms do not relent  Today, I have spent 8 minutes reviewing the chart, including problem list, medications, and with the patient with telehealth technology discussing the above problems.   Medication Adjustments/Labs and Tests Ordered: Current medicines are reviewed at length with the patient today.  Concerns regarding medicines are outlined above.   Tests Ordered: No orders of the defined types were placed in this encounter.   Medication Changes: Meds ordered this encounter  Medications   Chlorphen-PE-Acetaminophen (NOREL AD) 4-10-325 MG TABS    Sig: Take 1 tablet every 4 hours; Do not take more than 6 tables in 24 hours.    Dispense:  30 tablet    Refill:  0     Note: This dictation was prepared with Dragon dictation along with smaller phrase technology. Similar sounding words can be transcribed inadequately or may not be corrected upon review. Any transcriptional errors that result from this process are unintentional.      Disposition:  Follow up  Signed, Gilmore Laroche, FNP  04/22/2022 3:35 PM     [facility]

## 2022-04-30 ENCOUNTER — Telehealth: Payer: Self-pay | Admitting: Family Medicine

## 2022-04-30 ENCOUNTER — Other Ambulatory Visit: Payer: Self-pay | Admitting: Family Medicine

## 2022-04-30 DIAGNOSIS — B9689 Other specified bacterial agents as the cause of diseases classified elsewhere: Secondary | ICD-10-CM

## 2022-04-30 MED ORDER — AMOXICILLIN-POT CLAVULANATE 875-125 MG PO TABS: 1.0000 | ORAL_TABLET | Freq: Two times a day (BID) | ORAL | 0 refills | Status: AC

## 2022-04-30 NOTE — Telephone Encounter (Signed)
Patient called in regard to tele visit on 9/14  Patient still not feeling well, medicine is not helping, wants a call back in regard .

## 2022-04-30 NOTE — Telephone Encounter (Signed)
Antibiotic sent

## 2022-04-30 NOTE — Telephone Encounter (Signed)
Pt informed

## 2022-04-30 NOTE — Telephone Encounter (Signed)
Pt states she is still not feeling well, still having green mucus, now having a cough, and sinus pressure, medication only makes her sleepy.

## 2022-05-20 ENCOUNTER — Other Ambulatory Visit: Payer: Self-pay | Admitting: Cardiology

## 2022-06-04 IMAGING — CR DG CHEST 2V
2 series · 2 of 2 positions shown · non-contrast
Comparison: Chest radiograph March 31, 2020.

CLINICAL DATA: Shortness of breath and lower extremity swelling
times several months. COVID positive April 2019

EXAM:
CHEST - 2 VIEW

[w pa chest]
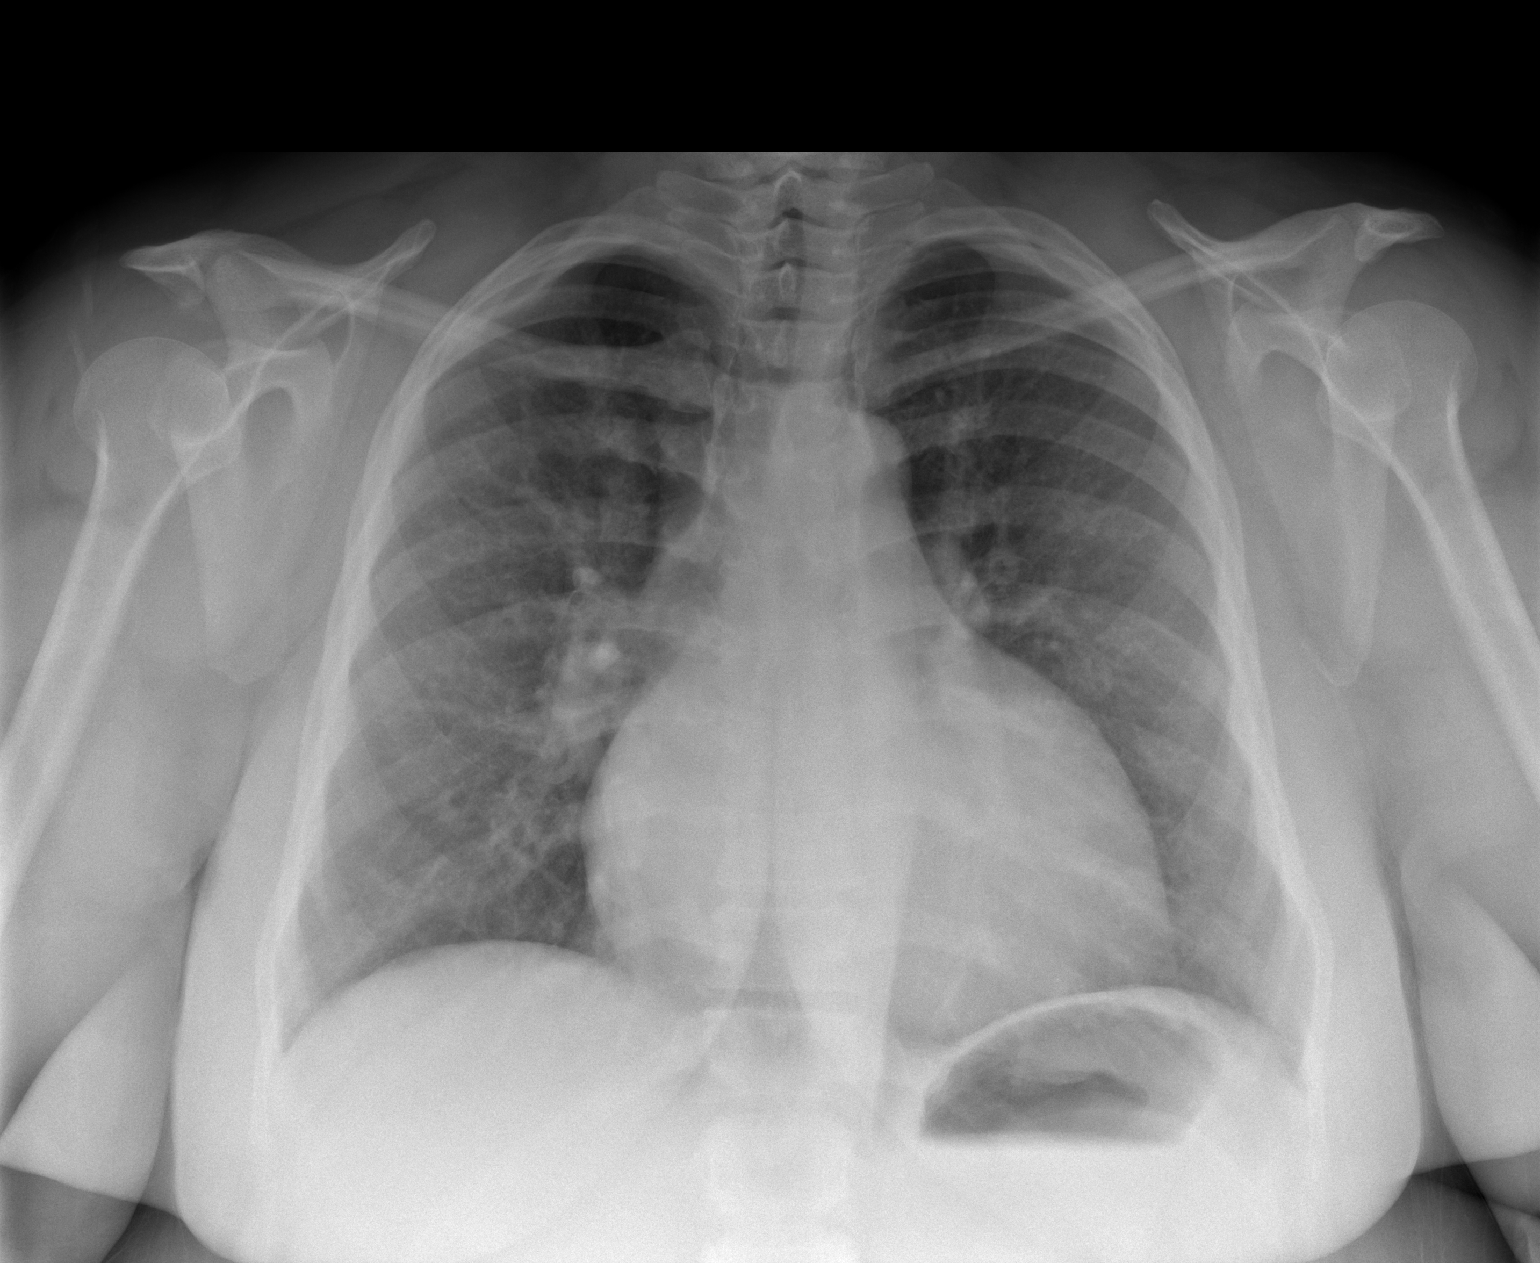

[w chest lat]
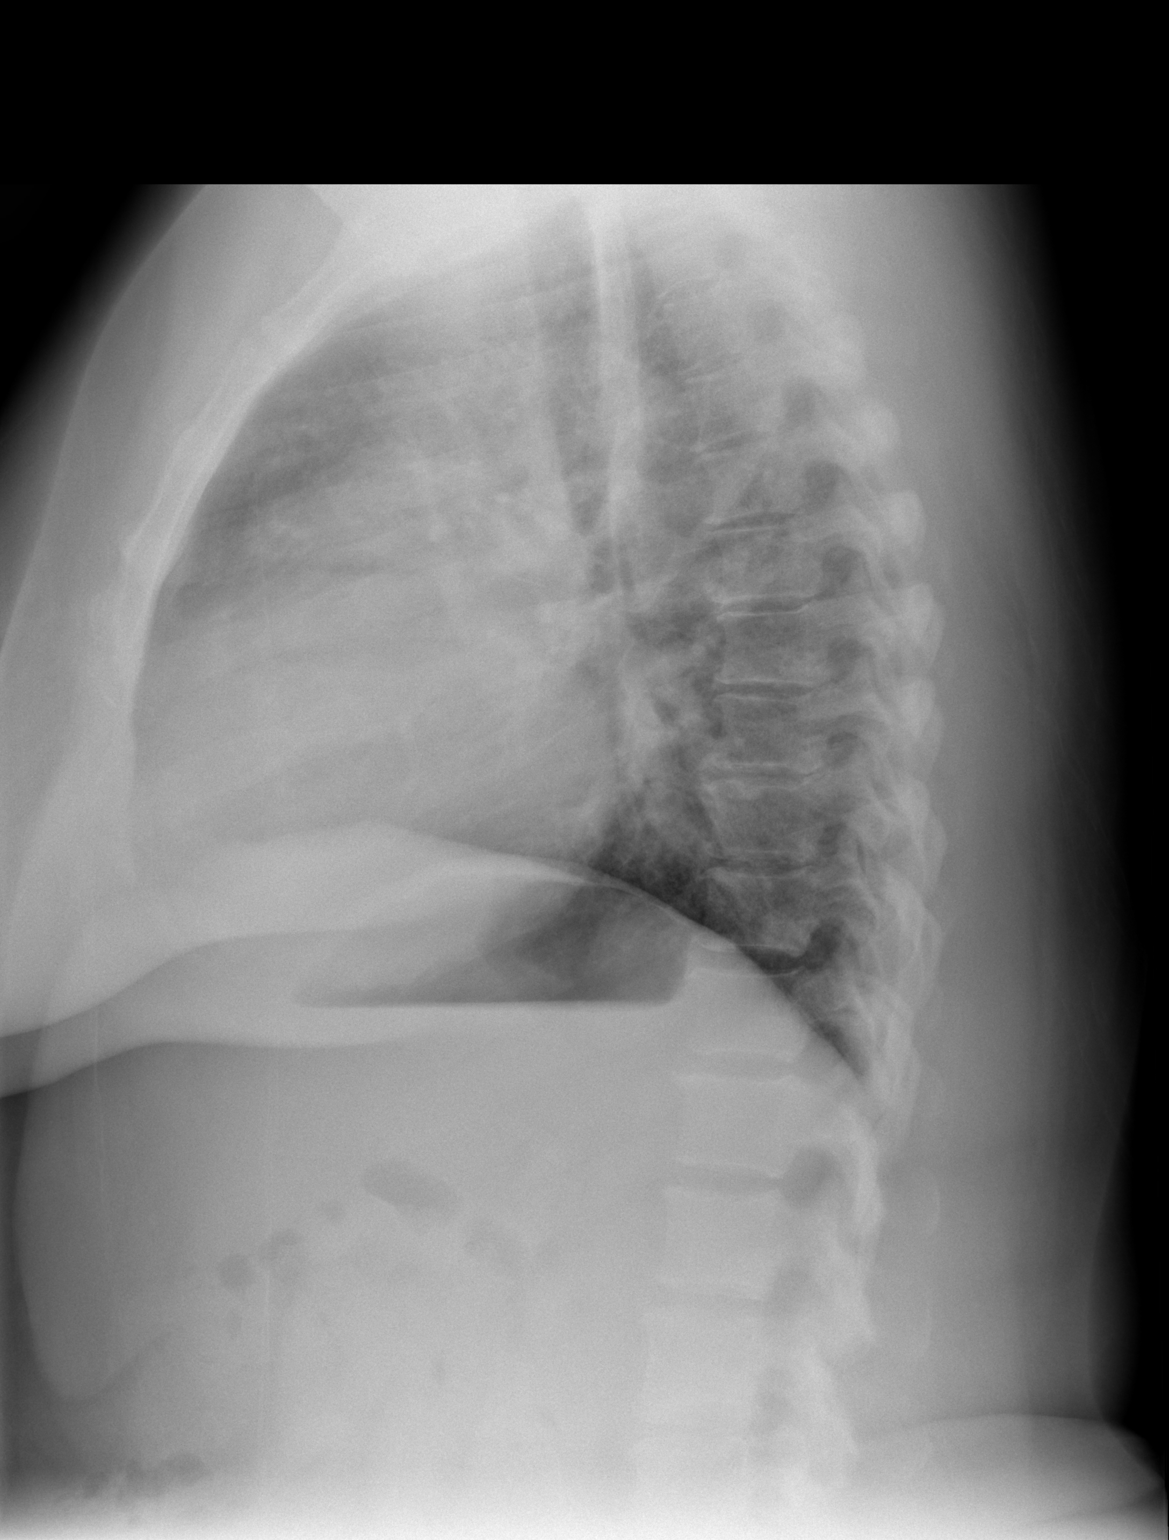

[2 of 2 positions shown; findings below may reference images not displayed]

FINDINGS: The heart size and mediastinal contours are within normal limits. No
focal consolidation. No pleural effusion. No pneumothorax. The
visualized skeletal structures are unremarkable.
IMPRESSION: No active cardiopulmonary disease.

## 2022-08-03 ENCOUNTER — Encounter: Payer: Self-pay | Admitting: Cardiology

## 2022-08-04 ENCOUNTER — Other Ambulatory Visit: Payer: Self-pay | Admitting: *Deleted

## 2022-08-04 MED ORDER — POTASSIUM CHLORIDE CRYS ER 20 MEQ PO TBCR: 40.0000 meq | EXTENDED_RELEASE_TABLET | Freq: Every day | ORAL | 3 refills | Status: DC

## 2022-11-30 ENCOUNTER — Other Ambulatory Visit: Payer: Self-pay | Admitting: Cardiology

## 2023-02-03 ENCOUNTER — Telehealth: Payer: Self-pay | Admitting: Cardiology

## 2023-02-03 MED ORDER — METOPROLOL SUCCINATE ER 25 MG PO TB24: 12.5000 mg | ORAL_TABLET | Freq: Every day | ORAL | 0 refills | Status: DC

## 2023-02-03 NOTE — Telephone Encounter (Signed)
Refilled to [facility] in [place]  Toprol XL 12.5 mg every day, #45   Needs f/u apt in August,messaged scheduler,sent pt MyChart message

## 2023-02-03 NOTE — Telephone Encounter (Signed)
*  STAT* If patient is at the pharmacy, call can be transferred to refill team.   1. Which medications need to be refilled? (please list name of each medication and dose if known) metoprolol succinate (TOPROL-XL) 25 MG 24 hr tablet  2. Which pharmacy/location (including street and city if local pharmacy) is medication to be sent to? [facility] - [address] [place], [place], [place] [address] (fax#: [phone])  3. Do they need a 30 day or 90 day supply?  90 day supply

## 2023-04-20 ENCOUNTER — Telehealth: Payer: Self-pay | Admitting: Family Medicine

## 2023-04-20 ENCOUNTER — Other Ambulatory Visit: Payer: Self-pay | Admitting: Cardiology

## 2023-04-20 NOTE — Telephone Encounter (Signed)
Pt states yearly visit is due states she needs labwork done prior to CPE, would like to have labs done in [place] and will come to cpe visit on 06/03/2023, okay to do this, or should she wait for actual office visit to do CPE, let patient know I need to run this by PCP for approval?

## 2023-04-20 NOTE — Telephone Encounter (Signed)
Patient called asking for labs to be done. Send order to [facility] [address] [place]. [name], [place] fax # [phone]. patient is currently in [place] and needs blood work done in [place], call patient once order has been sent there so she can go there to do her labs.  She will be back in town to do her CPE on 10.25.2024.

## 2023-04-21 MED ORDER — METOPROLOL SUCCINATE ER 25 MG PO TB24: 12.5000 mg | ORAL_TABLET | Freq: Every day | ORAL | 0 refills | Status: DC

## 2023-04-21 NOTE — Telephone Encounter (Signed)
This is a Nurse, mental health pt

## 2023-04-23 ENCOUNTER — Other Ambulatory Visit: Payer: Self-pay | Admitting: Family Medicine

## 2023-04-23 DIAGNOSIS — R7301 Impaired fasting glucose: Secondary | ICD-10-CM

## 2023-04-23 DIAGNOSIS — E7849 Other hyperlipidemia: Secondary | ICD-10-CM

## 2023-04-23 DIAGNOSIS — E559 Vitamin D deficiency, unspecified: Secondary | ICD-10-CM

## 2023-04-23 DIAGNOSIS — E038 Other specified hypothyroidism: Secondary | ICD-10-CM

## 2023-04-23 NOTE — Telephone Encounter (Signed)
That's fine. I've placed her orders

## 2023-04-25 ENCOUNTER — Ambulatory Visit: Payer: Self-pay | Admitting: Cardiology

## 2023-04-25 NOTE — Telephone Encounter (Signed)
Spoke to pt let her know orders are in. Pt states if she has any issues she will give [place] a call.

## 2023-04-25 NOTE — Progress Notes (Deleted)
Clinical Summary [name] is a 52 y.o.female seen today for follow up of the following medical problems.      1. Chronic sysotlic HF - 11/2020 echo LVEF 40-45%, normal diastolic fuxn, normal RV function, severe pulm HTN PASP 72 -BNP 36      01/2021 RHC/LHC: no significant CAD. Mean PA 24, PCWP 14, CI 4.5 01/2021 echo: LVEF 50-55%, normal RV function.      - issues with low bp's, entresto was stopped and lasix was decreased     - No SOB/DOE, occasional LE edema - home weights 224 lbs and stable - compliant with meds       2. LBBB - unknown chronicity, new compared to 2015 EKG>      3. 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    Past Medical History:  Diagnosis Date   Anemia    Hernia, inguinal    Medical history non-contributory    Ventral hernia 10/02/2015     Allergies  Allergen Reactions   Other     Peaches- hives     Current Outpatient Medications  Medication Sig Dispense Refill   Biotin w/ Vitamins C & E (HAIR SKIN & NAILS GUMMIES PO) Take 2 each by mouth daily.     Chlorphen-PE-Acetaminophen (NOREL AD) 4-10-325 MG TABS Take 1 tablet every 4 hours; Do not take more than 6 tables in 24 hours. 30 tablet 0   furosemide (LASIX) 20 MG tablet TAKE 2 TABLETS BY MOUTH ONCE DAILY 180 tablet 1   levocetirizine (XYZAL) 5 MG tablet Take 5 mg by mouth every evening.     metoprolol succinate (TOPROL-XL) 25 MG 24 hr tablet Take 0.5 tablets (12.5 mg total) by mouth daily. 15 tablet 0   Multiple Vitamins-Minerals (MEGA MULTIVITAMIN FOR WOMEN) TABS Take by mouth. (Patient not taking: Reported on 04/06/2022)     potassium chloride SA (KLOR-CON M20) 20 MEQ tablet Take 2 tablets (40 mEq total) by mouth daily. 180 tablet 3   vitamin C (ASCORBIC ACID) 500 MG  tablet      Zinc 50 MG TABS      No current facility-administered medications for this visit.     Past Surgical History:  Procedure Laterality Date   ABDOMINAL HYSTERECTOMY     CESAREAN SECTION     x 2   ESOPHAGOGASTRODUODENOSCOPY N/A 11/28/2013   Procedure: ESOPHAGOGASTRODUODENOSCOPY (EGD);  Surgeon: Corbin Ade, MD;  Location: [facility];  Service: Endoscopy;  Laterality: N/A;  12:15   EYE SURGERY     INCISIONAL HERNIA REPAIR N/A 11/24/2015   Procedure: Sherald Hess HERNIORRHAPHY WITH MESH;  Surgeon: Franky Macho, MD;  Location: [facility] ORS;  Service: General;  Laterality: N/A;   INSERTION OF MESH N/A 11/24/2015   Procedure: INSERTION OF MESH;  Surgeon: Franky Macho, MD;  Location: [facility];  Service: General;  Laterality: N/A;   RIGHT/LEFT HEART CATH AND CORONARY ANGIOGRAPHY N/A 01/13/2021   Procedure: RIGHT/LEFT HEART CATH AND CORONARY ANGIOGRAPHY;  Surgeon: Corky Crafts, MD;  Location: [facility] INVASIVE CV LAB;  Service: Cardiovascular;  Laterality: N/A;   TONSILLECTOMY       Allergies  Allergen Reactions   Other  Peaches- hives      Family History  Problem Relation Age of Onset   Cancer Mother        lung   Other Father        was shot and killed   Hyperlipidemia Sister    Dementia Maternal Grandmother    Heart attack Maternal Grandfather    Colon cancer Neg Hx      Social History [name] reports that she has never smoked. She has never used smokeless tobacco. [name] reports current alcohol use.   Review of Systems CONSTITUTIONAL: No weight loss, fever, chills, weakness or fatigue.  HEENT: Eyes: No visual loss, blurred vision, double vision or yellow sclerae.No hearing loss, sneezing, congestion, runny nose or sore throat.  SKIN: No rash or itching.  CARDIOVASCULAR:  RESPIRATORY: No shortness of breath, cough or sputum.  GASTROINTESTINAL: No anorexia, nausea, vomiting or diarrhea. No abdominal pain or blood.  GENITOURINARY: No burning on urination,  no polyuria NEUROLOGICAL: No headache, dizziness, syncope, paralysis, ataxia, numbness or tingling in the extremities. No change in bowel or bladder control.  MUSCULOSKELETAL: No muscle, back pain, joint pain or stiffness.  LYMPHATICS: No enlarged nodes. No history of splenectomy.  PSYCHIATRIC: No history of depression or anxiety.  ENDOCRINOLOGIC: No reports of sweating, cold or heat intolerance. No polyuria or polydipsia.  Marland Kitchen   Physical Examination There were no vitals filed for this visit. There were no vitals filed for this visit.  Gen: resting comfortably, no acute distress HEENT: no scleral icterus, pupils equal round and reactive, no palptable cervical adenopathy,  CV Resp: Clear to auscultation bilaterally GI: abdomen is soft, non-tender, non-distended, normal bowel sounds, no hepatosplenomegaly MSK: extremities are warm, no edema.  Skin: warm, no rash Neuro:  no focal deficits Psych: appropriate affect   Diagnostic Studies   11/2020 echo IMPRESSIONS     1. Left ventricular ejection fraction, by estimation, is 40 to 45%. The  left ventricle has mildly decreased function. The left ventricle  demonstrates global hypokinesis with paradoxical septal motion suggestive  of LBBB. There is mild left ventricular  hypertrophy. Left ventricular diastolic parameters were normal.   2. Right ventricular systolic function is normal. The right ventricular  size is normal. There is severely elevated pulmonary artery systolic  pressure. The estimated right ventricular systolic pressure is 71.6 mmHg  (TR Doppler jet might be contaminated  by LVOT flow).   3. Left atrial size was moderately dilated.   4. The mitral valve is grossly normal. Moderate mitral valve  regurgitation, posteriorly directed.   5. The aortic valve is tricuspid. Aortic valve regurgitation is not  visualized.   6. The inferior vena cava is normal in size with greater than 50%  respiratory variability, suggesting  right atrial pressure of 3 mmHg.   01/2021 RHC/LHC There is moderate to severe left ventricular systolic dysfunction. The left ventricular ejection fraction is 25-35% by visual estimate. LV end diastolic pressure is normal. There is no aortic valve stenosis. Ao sat 100%, PA sat 81%, mean PA pressure 24 mm Hg; mean PCWP 14 mm Hg; CO 8.3 L/min; CI 4.5. Upper normal PA pressure. No angiographically apparent coronary artery disease.   Medical therapy for nonischemic cardiomyopathy.    Assessment and Plan   1. Chronic systolic HF - new diagnosis based on 11/2020 echo, LVEF 40-45% - LVEF has since normalized - cath without significant coronary disease - medical therapy limited by low bp's, had to d/c entresto.   - no  symptoms, continue current meds   2. Pulmonary HTN - severe by echo, normal RV function - RHC showed high normal PA pressure, this was after significant diuresis - pulm HTN was secondary to left sided heart disease that has since imrpvoed with resolution of pulm HTN.    - continue to monitor     3. Chronic LBBB - no evidence of CAD by cath - EKG today shows SR, chronic LBBB       Antoine Poche, M.D., F.A.C.C.

## 2023-05-05 DIAGNOSIS — R7301 Impaired fasting glucose: Secondary | ICD-10-CM | POA: Diagnosis not present

## 2023-05-05 DIAGNOSIS — E7849 Other hyperlipidemia: Secondary | ICD-10-CM | POA: Diagnosis not present

## 2023-05-05 DIAGNOSIS — E559 Vitamin D deficiency, unspecified: Secondary | ICD-10-CM | POA: Diagnosis not present

## 2023-05-05 DIAGNOSIS — E038 Other specified hypothyroidism: Secondary | ICD-10-CM | POA: Diagnosis not present

## 2023-05-06 LAB — LIPID PANEL

## 2023-05-07 LAB — CBC WITH DIFFERENTIAL/PLATELET
Basophils Absolute: 0 10*3/uL (ref 0.0–0.2)
Basos: 1 %
EOS (ABSOLUTE): 0.2 10*3/uL (ref 0.0–0.4)
Eos: 2 %
Hematocrit: 37.9 % (ref 34.0–46.6)
Hemoglobin: 12.1 g/dL (ref 11.1–15.9)
Immature Grans (Abs): 0 10*3/uL (ref 0.0–0.1)
Immature Granulocytes: 0 %
Lymphocytes Absolute: 2.9 10*3/uL (ref 0.7–3.1)
Lymphs: 33 %
MCH: 29.7 pg (ref 26.6–33.0)
MCHC: 31.9 g/dL (ref 31.5–35.7)
MCV: 93 fL (ref 79–97)
Monocytes Absolute: 0.7 10*3/uL (ref 0.1–0.9)
Monocytes: 8 %
Neutrophils Absolute: 5 10*3/uL (ref 1.4–7.0)
Neutrophils: 56 %
Platelets: 295 10*3/uL (ref 150–450)
RBC: 4.08 x10E6/uL (ref 3.77–5.28)
RDW: 15.2 % (ref 11.7–15.4)
WBC: 8.8 10*3/uL (ref 3.4–10.8)

## 2023-05-07 LAB — CMP14+EGFR
ALT: 16 IU/L (ref 0–32)
AST: 13 IU/L (ref 0–40)
Albumin: 4.2 g/dL (ref 3.9–4.9)
Alkaline Phosphatase: 56 IU/L (ref 44–121)
BUN/Creatinine Ratio: 11 (ref 9–23)
BUN: 8 mg/dL (ref 6–24)
Bilirubin Total: 0.3 mg/dL (ref 0.0–1.2)
CO2: 23 mmol/L (ref 20–29)
Calcium: 9.3 mg/dL (ref 8.7–10.2)
Chloride: 101 mmol/L (ref 96–106)
Creatinine, Ser: 0.76 mg/dL (ref 0.57–1.00)
Globulin, Total: 3 g/dL (ref 1.5–4.5)
Glucose: 80 mg/dL (ref 70–99)
Potassium: 3.8 mmol/L (ref 3.5–5.2)
Sodium: 140 mmol/L (ref 134–144)
Total Protein: 7.2 g/dL (ref 6.0–8.5)
eGFR: 96 mL/min/{1.73_m2} (ref >59)

## 2023-05-07 LAB — LIPID PANEL
Cholesterol, Total: 200 mg/dL — ABNORMAL HIGH (ref 100–199)
HDL: 58 mg/dL (ref >39)
LDL CALC COMMENT:: 3.4 ratio (ref 0.0–4.4)
LDL Chol Calc (NIH): 132 mg/dL — ABNORMAL HIGH (ref 0–99)
Triglycerides: 56 mg/dL (ref 0–149)
VLDL Cholesterol Cal: 10 mg/dL (ref 5–40)

## 2023-05-07 LAB — HEMOGLOBIN A1C
Est. average glucose Bld gHb Est-mCnc: 108 mg/dL
Hgb A1c MFr Bld: 5.4 % (ref 4.8–5.6)

## 2023-05-07 LAB — TSH+FREE T4
Free T4: 1.32 ng/dL (ref 0.82–1.77)
TSH: 1.51 u[IU]/mL (ref 0.450–4.500)

## 2023-05-07 LAB — VITAMIN D 25 HYDROXY (VIT D DEFICIENCY, FRACTURES): Vit D, 25-Hydroxy: 52.7 ng/mL (ref 30.0–100.0)

## 2023-05-10 ENCOUNTER — Other Ambulatory Visit: Payer: Self-pay | Admitting: Family Medicine

## 2023-05-10 DIAGNOSIS — E785 Hyperlipidemia, unspecified: Secondary | ICD-10-CM

## 2023-05-10 MED ORDER — ROSUVASTATIN CALCIUM 10 MG PO TABS
10.0000 mg | ORAL_TABLET | Freq: Every day | ORAL | 3 refills | Status: DC
Start: 2023-05-10 — End: 2023-05-11

## 2023-05-10 NOTE — Progress Notes (Unsigned)
The ASCVD Risk score (Arnett DK, et al., 2019) failed to calculate for the following reasons:    The systolic blood pressure is missing

## 2023-05-11 ENCOUNTER — Telehealth: Payer: Self-pay | Admitting: Family Medicine

## 2023-05-11 ENCOUNTER — Other Ambulatory Visit: Payer: Self-pay

## 2023-05-11 DIAGNOSIS — E785 Hyperlipidemia, unspecified: Secondary | ICD-10-CM

## 2023-05-11 MED ORDER — ROSUVASTATIN CALCIUM 10 MG PO TABS: 10.0000 mg | ORAL_TABLET | Freq: Every day | ORAL | 3 refills | Status: DC

## 2023-05-11 NOTE — Telephone Encounter (Signed)
Refill sent.

## 2023-05-11 NOTE — Telephone Encounter (Signed)
Pt called in is out of town needs   rosuvastatin (CRESTOR) 10 MG tablet [[id number]]  Sent to [facility]  Address: [address] [place], [place], [place] [address] Phone: [phone] Fax: [phone]

## 2023-06-03 ENCOUNTER — Encounter: Payer: Self-pay | Admitting: Family Medicine

## 2023-06-03 ENCOUNTER — Ambulatory Visit (INDEPENDENT_AMBULATORY_CARE_PROVIDER_SITE_OTHER): Payer: BC Managed Care – PPO | Admitting: Family Medicine

## 2023-06-03 VITALS — BP 152/92 | HR 70 | Ht <= 58 in | Wt 220.1 lb

## 2023-06-03 DIAGNOSIS — Z0001 Encounter for general adult medical examination with abnormal findings: Secondary | ICD-10-CM | POA: Diagnosis not present

## 2023-06-03 DIAGNOSIS — R030 Elevated blood-pressure reading, without diagnosis of hypertension: Secondary | ICD-10-CM | POA: Diagnosis not present

## 2023-06-03 DIAGNOSIS — Z23 Encounter for immunization: Secondary | ICD-10-CM

## 2023-06-03 NOTE — Patient Instructions (Addendum)
I appreciate the opportunity to provide care to you today!    Follow up:  1 year for CPE  Hypertension Management  -Your current blood pressure is above the target goal of <140/90 mmHg. To address this, please implement lifestyle changes  with a heart healthy diet and increased physical activity.   -continue taking metoprolol 12.5 mg daily   check your blood pressure daily. If your first reading is >140/90 mmHg, wait at least 10 minutes and recheck your blood pressure. Diet and Lifestyle: Adhere to a low-sodium diet, limiting intake to less than 1500 mg daily, and increase your physical activity.  Hydration and Nutrition: Stay well-hydrated by drinking at least 64 ounces of water daily. Increase your servings of fruits and vegetables and avoid excessive sodium in your diet. Long-Term Considerations: Uncontrolled hypertension can increase the risk of cardiovascular diseases, including stroke, coronary artery disease, and heart failure.  Please report to the emergency department if your blood pressure exceeds 180/120 and is accompanied by symptoms such as headaches, chest pain, palpitations, blurred vision, or dizziness.   Here are some foods to avoid or reduce in your diet to help manage cholesterol levels:  Fried Foods:Deep-fried items such as french fries, fried chicken, and fried snacks are high in unhealthy fats and can raise LDL (bad) cholesterol levels. Processed Meats:Foods like bacon, sausage, hot dogs, and deli meats are often high in saturated fat and cholesterol. Full-Fat Dairy Products:Whole milk, full-fat yogurt, butter, cream, and cheese are rich in saturated fats, which can increase cholesterol levels. Baked Goods and Sweets:Pastries, cakes, cookies, and donuts often contain trans fats and added sugars, which can raise LDL cholesterol and lower HDL (good) cholesterol. Red Meat:Beef, lamb, and pork are high in saturated fat. Lean cuts or plant-based protein alternatives are better  options. Lard and Shortening:Used in some baked goods, lard and shortening are high in trans fats and should be avoided. Fast Food:Many fast food items are cooked with unhealthy oils and contain high amounts of saturated and trans fats. Processed Snacks:Chips, crackers, and certain microwave popcorns can contain trans fats and high levels of unhealthy oils. Shellfish:While nutritious in other ways, some shellfish like shrimp, lobster, and crab are high in cholesterol. They should be consumed in moderation. Coconut and Palm Oils:these oils are high in saturated fat and can raise cholesterol levels when used in cooking or baking.    Attached with your AVS, you will find valuable resources for self-education. I highly recommend dedicating some time to thoroughly examine them.   Please continue to a heart-healthy diet and increase your physical activities. Try to exercise for at least five days a week.    It was a pleasure to see you and I look forward to continuing to work together on your health and well-being. Please do not hesitate to call the office if you need care or have questions about your care.  In case of emergency, please visit the Emergency Department for urgent care, or contact our clinic at [phone] to schedule an appointment. We're here to help you!   Have a wonderful day and week. With Gratitude, Gilmore Laroche MSN, FNP-BC

## 2023-06-03 NOTE — Progress Notes (Unsigned)
Complete physical exam  Patient: [name]   DOB: [birth year]   52 y.o. Female  MRN: [id number]  Subjective:    Chief Complaint  Patient presents with  . Establish Care    CPE    [name] is a 52 y.o. female who presents today for a complete physical exam. She reports consuming a {diet types:17450} diet. {types:19826} She generally feels {DESC; WELL/FAIRLY WELL/POORLY:18703}. She reports sleeping {DESC; WELL/FAIRLY WELL/POORLY:18703}. She {does/does not:200015} have additional problems to discuss today.    The 10-year ASCVD risk score (Arnett DK, et al., 2019) is: 5.3%   Values used to calculate the score:     Age: 79 years     Sex: Female     Is Non-Hispanic African American: Yes     Diabetic: No     Tobacco smoker: No     Systolic Blood Pressure: 172 mmHg     Is BP treated: No     HDL Cholesterol: 58 mg/dL     Total Cholesterol: 200 mg/dL   Most recent fall risk assessment:    04/22/2022    8:32 AM  Fall Risk   Falls in the past year? 0  Number falls in past yr: 0  Injury with Fall? 0  Risk for fall due to : No Fall Risks  Follow up Falls evaluation completed     Most recent depression screenings:    06/03/2023    1:21 PM 04/22/2022    8:32 AM  PHQ 2/9 Scores  PHQ - 2 Score 5 0  PHQ- 9 Score 13     {VISON DENTAL STD PSA (Optional):27386}  {History (Optional):23778}  Patient Care Team: Gilmore Laroche, FNP as PCP - General (Family Medicine) Branch, Dorothe Pea, MD as PCP - Cardiology (Cardiology) [place] Bali, MD (Inactive) as Consulting Physician (Gastroenterology)   Outpatient Medications Prior to Visit  Medication Sig  . Chlorphen-PE-Acetaminophen (NOREL AD) 4-10-325 MG TABS Take 1 tablet every 4 hours; Do not take more than 6 tables in 24 hours.  . Elderberry-Vitamin C-Zinc 50-45-3.8 MG CHEW Chew 2 1e11 Vector Genomes by mouth 1 day or 1 dose.  . Multiple Vitamins-Minerals (MEGA MULTIVITAMIN FOR WOMEN) TABS Take by mouth.  . Biotin  w/ Vitamins C & E (HAIR SKIN & NAILS GUMMIES PO) Take 2 each by mouth daily. (Patient not taking: Reported on 06/03/2023)  . furosemide (LASIX) 20 MG tablet TAKE 2 TABLETS BY MOUTH ONCE DAILY  . levocetirizine (XYZAL) 5 MG tablet Take 5 mg by mouth every evening.  . metoprolol succinate (TOPROL-XL) 25 MG 24 hr tablet Take 0.5 tablets (12.5 mg total) by mouth daily.  . potassium chloride SA (KLOR-CON M20) 20 MEQ tablet Take 2 tablets (40 mEq total) by mouth daily.  . rosuvastatin (CRESTOR) 10 MG tablet Take 1 tablet (10 mg total) by mouth daily.  . vitamin C (ASCORBIC ACID) 500 MG tablet  (Patient not taking: Reported on 06/03/2023)  . Zinc 50 MG TABS  (Patient not taking: Reported on 06/03/2023)   No facility-administered medications prior to visit.    ROS     Objective:    BP (!) 172/93   Pulse 70   Ht 4\' 7"  (1.397 m)   Wt 220 lb 1.9 oz (99.8 kg)   SpO2 98%   BMI 51.16 kg/m  {Vitals History (Optional):23777}  Physical Exam  No results found for any visits on 06/03/23. {Show previous labs (optional):23779}    Assessment & Plan:  Routine Health Maintenance and Physical Exam  Immunization History  Administered Date(s) Administered  . Moderna Sars-Covid-2 Vaccination 09/19/2019, 10/17/2019, 06/26/2020    Health Maintenance  Topic Date Due  . Hepatitis C Screening  Never done  . DTaP/Tdap/Td (1 - Tdap) Never done  . Colonoscopy  Never done  . MAMMOGRAM  02/20/2023  . INFLUENZA VACCINE  Never done  . COVID-19 Vaccine (4 - 2023-24 season) 04/10/2023  . HIV Screening  Completed  . HPV VACCINES  Aged Out    Discussed health benefits of physical activity, and encouraged her to engage in regular exercise appropriate for her age and condition.  There are no diagnoses linked to this encounter.  No follow-ups on file.     Gilmore Laroche, FNP

## 2023-06-04 DIAGNOSIS — R030 Elevated blood-pressure reading, without diagnosis of hypertension: Secondary | ICD-10-CM | POA: Insufficient documentation

## 2023-06-04 DIAGNOSIS — Z0001 Encounter for general adult medical examination with abnormal findings: Secondary | ICD-10-CM | POA: Insufficient documentation

## 2023-06-04 DIAGNOSIS — Z23 Encounter for immunization: Secondary | ICD-10-CM | POA: Insufficient documentation

## 2023-06-04 NOTE — Assessment & Plan Note (Signed)

## 2023-06-04 NOTE — Assessment & Plan Note (Signed)
Patient educated on CDC recommendation for the vaccine. Verbal consent was obtained from the patient, vaccine administered by nurse, no sign of adverse reactions noted at this time. Patient education on arm soreness and use of tylenol or ibuprofen for this patient  was discussed. Patient educated on the signs and symptoms of adverse effect and advise to contact the office if they occur.  

## 2023-06-04 NOTE — Assessment & Plan Note (Signed)
Uncontrolled Hypertension: The patient is asymptomatic in the clinic. She reports dealing with family and personal issues, along with minimal sleep, which she believes has contributed to her elevated blood pressure. The patient declines pharmacological therapy, admitting that she would not take any prescribed medication. She reports that she will implement lifestyle changes. The patient was advised to check her blood pressure daily and to inform me if it consistently exceeds 140/90 so that treatment can be initiated. The patient verbalized understanding. BP Readings from Last 3 Encounters:  06/03/23 (!) 152/92  04/06/22 118/73  04/06/22 125/75

## 2023-06-06 ENCOUNTER — Ambulatory Visit: Payer: BC Managed Care – PPO | Attending: Physician Assistant | Admitting: Physician Assistant

## 2023-06-06 ENCOUNTER — Encounter: Payer: Self-pay | Admitting: Physician Assistant

## 2023-06-06 VITALS — BP 160/80 | HR 91 | Ht <= 58 in | Wt 218.4 lb

## 2023-06-06 DIAGNOSIS — I509 Heart failure, unspecified: Secondary | ICD-10-CM

## 2023-06-06 DIAGNOSIS — I272 Pulmonary hypertension, unspecified: Secondary | ICD-10-CM | POA: Diagnosis not present

## 2023-06-06 DIAGNOSIS — I10 Essential (primary) hypertension: Secondary | ICD-10-CM

## 2023-06-06 DIAGNOSIS — E7849 Other hyperlipidemia: Secondary | ICD-10-CM | POA: Diagnosis not present

## 2023-06-06 DIAGNOSIS — I447 Left bundle-branch block, unspecified: Secondary | ICD-10-CM

## 2023-06-06 DIAGNOSIS — G4733 Obstructive sleep apnea (adult) (pediatric): Secondary | ICD-10-CM

## 2023-06-06 MED ORDER — METOPROLOL SUCCINATE ER 25 MG PO TB24: 25.0000 mg | ORAL_TABLET | Freq: Every day | ORAL | 3 refills | Status: DC

## 2023-06-06 NOTE — Progress Notes (Signed)
Cardiology Office Note:  .   Date:  06/06/2023  ID:  Aldean Baker, DOB 03-Oct-1970, MRN [id number] PCP: Gilmore Laroche, FNP  [facility] Providers Cardiologist:  Dina Rich, MD    History of Present Illness: .   [name] is a 52 y.o. female with history of HF recovered EF, severe pulm HTN, mild OSA, LBBB.  Patient here for yearly f/u. She had PCP visit Friday and BP was 172/93. She wanted to wait for [place] to adjust. She's not sleeping well, can't turn off her brain and family stress. 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 She can't take melatonin or similar drugs. Very sensitive to meds.  Keeps her sodium level below 1500 mg a day. Occasional frozen dinner with less than 500 mg. Does piliates nightly and has lost about 40 lbs. No chest pain, dyspnea, palpitations, edema, or cardiac complaints. Diagnosed with HLD and started on rosuvastatin. HR 50-152 on apple watch.   ROS:    Studies Reviewed: Marland Kitchen    EKG Interpretation Date/Time:  Monday June 06 2023 12:10:51 EDT Ventricular Rate:  87 PR Interval:  170 QRS Duration:  142 QT Interval:  410 QTC Calculation: 493 R Axis:   82  Text Interpretation: Normal sinus rhythm Left bundle branch block When compared with ECG of 2023 Left bundle branch block Present Confirmed by Jacolyn Reedy [phone]) on 06/06/2023 12:14:35 PM    Prior CV Studies:   Echo 01/2021 IMPRESSIONS     1. Limited study.   2. Left ventricular ejection fraction, by estimation, is 50 to 55%. The  left ventricle has low normal function.   3. Right ventricular systolic function is normal. The right ventricular  size is normal.   4. The inferior vena cava is normal in size with greater than 50%  respiratory variability, suggesting right atrial pressure of 3 mmHg.   FINDINGS   Left Ventricle: Left ventricular ejection fraction, by estimation, is 50  to 55%. The left ventricle has low normal function. The left ventricular   internal cavity size was normal in size. There is borderline left  ventricular hypertrophy. Abnormal  (paradoxical) septal motion, consistent with left bundle branch block.   Right Ventricle: The right ventricular size is normal. No increase in  right ventricular wall thickness. Right ventricular systolic function is  normal.   Aorta: The aortic root is normal in size and structure.   Venous: The inferior vena cava is normal in size with greater than 50%  respiratory variability, suggesting right atrial pressure of 3 mmHg.    Risk Assessment/Calculations:     HYPERTENSION CONTROL Vitals:   06/06/23 1141 06/06/23 1212  BP: (!) 140/88 (!) 160/80    The patient's blood pressure is elevated above target today.  In order to address the patient's elevated BP: Blood pressure will be monitored at home to determine if medication changes need to be made.; A current anti-hypertensive medication was adjusted today.; Follow up with primary care provider for management.; Follow up with general cardiology has been recommended.          Physical Exam:   VS:  BP (!) 160/80   Pulse 91   Ht 4\' 7"  (1.397 m)   Wt 218 lb 6.4 oz (99.1 kg)   SpO2 96%   BMI 50.76 kg/m    Wt Readings from Last 3 Encounters:  06/06/23 218 lb 6.4 oz (99.1 kg)  06/03/23 220 lb 1.9 oz (99.8 kg)  04/06/22 227  lb 0.6 oz (103 kg)    GEN: Well nourished, well developed in no acute distress NECK: No JVD; No carotid bruits CARDIAC:  RRR, no murmurs, rubs, gallops RESPIRATORY:  Clear to auscultation without rales, wheezing or rhonchi  ABDOMEN: Soft, non-tender, non-distended EXTREMITIES:  No edema; No deformity   ASSESSMENT AND PLAN: .    Chronic systolic CHF EF 40-45% echo 11/2020, 50-55% echo 01/2021 -entresto stopped due to low BP's. Not using lasix.  Severe pulmonary HTN by echo RHC normal PA pressure after significant diuresis  HTN-BP high last week after not sleeping for 3 nights. BP 160/80  today. HR's  50-150 per apple watch. We'll increase metoprolol 25 mg daily.She'll monitor for 2 weeks and send [place] readings. Already on low sodium diet.   HLD-LDL 132 04/2023 started on rosuvastatin. I offered a self pay coronary calcium score but she wants to hold off at this time.  LBBB new since 2015 no CAD on cath 01/2021  OSA mild doesn't require CPAP  Obesity-has lost~40 lbs doing pilates        Dispo: f/u in 1 yr or sooner if needed.  Signed, Jacolyn Reedy, PA-C

## 2023-06-06 NOTE — Patient Instructions (Signed)
Medication Instructions:  Your physician has recommended you make the following change in your medication:   Increase Toprol XL to 25 mg Daily   *If you need a refill on your cardiac medications before your next appointment, please call your pharmacy*   Lab Work: NONE   If you have labs (blood work) drawn today and your tests are completely normal, you will receive your results only by: MyChart Message (if you have MyChart) OR A paper copy in the mail If you have any lab test that is abnormal or we need to change your treatment, we will call you to review the results.   Testing/Procedures: NONE    Follow-Up: At [facility], you and your health needs are our priority.  As part of our continuing mission to provide you with exceptional heart care, we have created designated Provider Care Teams.  These Care Teams include your primary Cardiologist (physician) and Advanced Practice Providers (APPs -  Physician Assistants and Nurse Practitioners) who all work together to provide you with the care you need, when you need it.  We recommend signing up for the patient portal called "MyChart".  Sign up information is provided on this After Visit Summary.  MyChart is used to connect with patients for Virtual Visits (Telemedicine).  Patients are able to view lab/test results, encounter notes, upcoming appointments, etc.  Non-urgent messages can be sent to your provider as well.   To learn more about what you can do with MyChart, go to [email or web address].    Your next appointment:   1 year(s)  Provider:   You may see Dina Rich, MD or one of the following Advanced Practice Providers on your designated Care Team:   Randall An, PA-C  Jacolyn Reedy, [place]     Other Instructions Thank you for choosing [facility]!

## 2023-06-10 ENCOUNTER — Telehealth: Payer: Self-pay | Admitting: Family Medicine

## 2023-06-10 NOTE — Telephone Encounter (Signed)
CPE form  Noted Copied Sleeved (put in provider box)  Once completed email back to patient [email or web address]  **patient lives in [place]**

## 2023-06-13 NOTE — Telephone Encounter (Signed)
Patient called asked when completed to email the form see message below

## 2023-06-17 ENCOUNTER — Telehealth: Payer: Self-pay | Admitting: Family Medicine

## 2023-06-17 NOTE — Telephone Encounter (Signed)
Copied from CRM [id number]. Topic: General - Other >> Jun 17, 2023 12:59 PM [name] wrote: Reason for CRM: Pt called to request signature from Gilmore Laroche for work. Pt needs form signed. Pt stated it was emailed to office. Please call pt at [phone].  Patient dropped off in office 11.01.2024

## 2023-06-19 NOTE — Telephone Encounter (Signed)
Forms completed

## 2023-06-20 NOTE — Telephone Encounter (Signed)
Email form to patient.

## 2023-06-20 NOTE — Telephone Encounter (Signed)
Pt came by to pick up.

## 2023-06-20 NOTE — Telephone Encounter (Signed)
competed

## 2023-07-04 ENCOUNTER — Telehealth: Payer: Self-pay | Admitting: Adult Health

## 2023-07-04 NOTE — Telephone Encounter (Signed)
Pt having hot flashes and hair growth, will need appt to discuss

## 2023-07-04 NOTE — Telephone Encounter (Signed)
Patient called asking if you could call her. She is traveling for work and wants to speak to you about some stuff going on. States to call her at [phone]

## 2023-07-18 ENCOUNTER — Ambulatory Visit: Payer: BC Managed Care – PPO | Admitting: Adult Health

## 2023-07-18 ENCOUNTER — Telehealth: Payer: BC Managed Care – PPO | Admitting: Adult Health

## 2023-07-18 ENCOUNTER — Encounter: Payer: Self-pay | Admitting: Adult Health

## 2023-07-18 VITALS — Ht <= 58 in | Wt 217.3 lb

## 2023-07-18 DIAGNOSIS — L689 Hypertrichosis, unspecified: Secondary | ICD-10-CM

## 2023-07-18 DIAGNOSIS — Z90710 Acquired absence of both cervix and uterus: Secondary | ICD-10-CM | POA: Diagnosis not present

## 2023-07-18 DIAGNOSIS — R232 Flushing: Secondary | ICD-10-CM

## 2023-07-18 MED ORDER — ESTRADIOL 1 MG PO TABS: 1.0000 mg | ORAL_TABLET | Freq: Every day | ORAL | 2 refills | Status: DC

## 2023-07-18 NOTE — Progress Notes (Signed)
Patient ID: [name], female   DOB: 1971-02-09, 52 y.o.   MRN: [id number]   TELEHEALTH GYNECOLOGY VISIT ENCOUNTER NOTE  [facility]: [facility]   Patient location: Home  I connected with [name] on 07/18/23 at  4:10 PM EST by telephone and verified that I am speaking with the correct person using two identifiers. Patient was unable to do MyChart audiovisual encounter due to technical difficulties, she tried several times.    I discussed the limitations, risks, security and privacy concerns of performing an evaluation and management service by telephone and the availability of in person appointments. I also discussed with the patient that there may be a patient responsible charge related to this service. The patient expressed understanding and agreed to proceed.   History:  [name] is a 52 y.o. [phone] female being evaluated today for hot flashes and increase in hair on face and legs and thicker on arms, has to wax.. She denies any other concerns.       Past Medical History:  Diagnosis Date   Anemia    Hernia, inguinal    Medical history non-contributory    Ventral hernia 10/02/2015   Past Surgical History:  Procedure Laterality Date   ABDOMINAL HYSTERECTOMY     CESAREAN SECTION     x 2   ESOPHAGOGASTRODUODENOSCOPY N/A 11/28/2013   Procedure: ESOPHAGOGASTRODUODENOSCOPY (EGD);  Surgeon: Corbin Ade, MD;  Location: [facility];  Service: Endoscopy;  Laterality: N/A;  12:15   EYE SURGERY     INCISIONAL HERNIA REPAIR N/A 11/24/2015   Procedure: Sherald Hess HERNIORRHAPHY WITH MESH;  Surgeon: Franky Macho, MD;  Location: [facility] ORS;  Service: General;  Laterality: N/A;   INSERTION OF MESH N/A 11/24/2015   Procedure: INSERTION OF MESH;  Surgeon: Franky Macho, MD;  Location: [facility] ORS;  Service: General;  Laterality: N/A;   RIGHT/LEFT HEART CATH AND CORONARY ANGIOGRAPHY N/A 01/13/2021   Procedure: RIGHT/LEFT HEART CATH AND CORONARY  ANGIOGRAPHY;  Surgeon: Corky Crafts, MD;  Location: [facility] INVASIVE CV LAB;  Service: Cardiovascular;  Laterality: N/A;   TONSILLECTOMY     The following portions of the patient's history were reviewed and updated as appropriate: allergies, current medications, past family history, past medical history, past social history, past surgical history and problem list.   Health Maintenance:  No pap needed sp hysterectomy. Normal mammogram on 02/19/22  Review of Systems:  Pertinent items noted in HPI and remainder of comprehensive ROS otherwise negative.  Physical Exam:   General:  Alert, oriented and cooperative.   Mental Status: Normal mood and affect perceived. Normal judgment and thought content.  Physical exam deferred due to nature of the encounter Ht 4\' 7"  (1.397 m)   Wt 217 lb 4.8 oz (98.6 kg)   BMI 50.51 kg/m    Upstream - 07/18/23 1638       Pregnancy Intention Screening   Does the patient want to become pregnant in the next year? N/A    Does the patient's partner want to become pregnant in the next year? N/A    Would the patient like to discuss contraceptive options today? N/A      Contraception Wrap Up   Current Method --   hyst   End Method --   hyst   Contraception Counseling Provided No             Labs and Imaging No results found for this or any previous visit (from  the past 336 hour(s)). No results found.    Assessment and Plan:     1. Hot flashes Hot flashes have gotten worse Denies MI,stroke, DVT or breast cancer Will rx estrace 1 mg 1 daily  Meds ordered this encounter  Medications   estradiol (ESTRACE) 1 MG tablet    Sig: Take 1 tablet (1 mg total) by mouth daily.    Dispense:  30 tablet    Refill:  2    Order Specific Question:   Supervising Provider    Answer:   Duane Lope H [[id number]]   Follow up in 8 weeks for ROS   2. S/P hysterectomy  3. Excessive hair growth Has more hair on face and legs and thicker on arms, has to wax       I  discussed the assessment and treatment plan with the patient. The patient was provided an opportunity to ask questions and all were answered. The patient agreed with the plan and demonstrated an understanding of the instructions.   The patient was advised to call back or seek an in-person evaluation/go to the ED if the symptoms worsen or if the condition fails to improve as anticipated.  I provided 10 minutes of non-face-to-face time during this encounter.   Cyril Mourning, NP [facility] [employer] [facility]

## 2023-08-01 ENCOUNTER — Ambulatory Visit: Payer: Self-pay | Admitting: Nurse Practitioner

## 2023-08-18 ENCOUNTER — Other Ambulatory Visit: Payer: Self-pay | Admitting: Adult Health

## 2023-08-18 MED ORDER — NITROFURANTOIN MONOHYD MACRO 100 MG PO CAPS: 100.0000 mg | ORAL_CAPSULE | Freq: Two times a day (BID) | ORAL | 0 refills | Status: AC

## 2023-08-18 NOTE — Progress Notes (Signed)
 Rx macrobid

## 2023-09-12 ENCOUNTER — Encounter: Payer: Self-pay | Admitting: Adult Health

## 2023-09-12 ENCOUNTER — Ambulatory Visit: Payer: BC Managed Care – PPO | Admitting: Adult Health

## 2023-09-12 VITALS — BP 138/88 | HR 68 | Ht <= 58 in | Wt 215.5 lb

## 2023-09-12 DIAGNOSIS — R232 Flushing: Secondary | ICD-10-CM

## 2023-09-12 DIAGNOSIS — Z90710 Acquired absence of both cervix and uterus: Secondary | ICD-10-CM | POA: Diagnosis not present

## 2023-09-12 MED ORDER — ESTRADIOL 1 MG PO TABS: 1.0000 mg | ORAL_TABLET | Freq: Every day | ORAL | 2 refills | Status: DC

## 2023-09-12 NOTE — Progress Notes (Signed)
  Subjective:     Patient ID: [name], female   DOB: March 13, 1971, 53 y.o.   MRN: [id number]  HPI Sacha is a 53 year old black female,single, sp hysterectomy back in follow up on taking estrace 1 mg for hot flashes and finally did not have flash yesterday and slept all night. She is a travel Engineer, civil (consulting).   PCP is Gilmore Laroche, FNP  Review of Systems Hot flashes better,none yesterday, slept last night She wants to lose weight, asked about phentermine  Reviewed past medical,surgical, social and family history. Reviewed medications and allergies.     Objective:   Physical Exam BP 138/88 (BP Location: Left Arm, Patient Position: Sitting, Cuff Size: Normal)   Pulse 68   Ht 4\' 7"  (1.397 m)   Wt 215 lb 8 oz (97.8 kg)   BMI 50.09 kg/m     Skin warm and dry.  Lungs: clear to ausculation bilaterally. Cardiovascular: regular rate and rhythm.  Fall risk is low  Upstream - 09/12/23 0907       Pregnancy Intention Screening   Does the patient want to become pregnant in the next year? N/A    Does the patient's partner want to become pregnant in the next year? N/A    Would the patient like to discuss contraceptive options today? N/A      Contraception Wrap Up   Current Method Abstinence;Female Sterilization   hyst   End Method Abstinence;Female Sterilization   hyst   Contraception Counseling Provided No             Assessment:    1. Hot flashes (Primary) Hot flashes are better Will refill estrace Meds ordered this encounter  Medications   estradiol (ESTRACE) 1 MG tablet    Sig: Take 1 tablet (1 mg total) by mouth daily.    Dispense:  90 tablet    Refill:  2    Supervising Provider:   Duane Lope H [[id number]]     2. S/P hysterectomy     Plan:    Keep check on BP  Follow up about 12/19/23 for ROS

## 2023-09-16 DIAGNOSIS — Z1231 Encounter for screening mammogram for malignant neoplasm of breast: Secondary | ICD-10-CM | POA: Diagnosis not present

## 2023-09-16 LAB — HM MAMMOGRAPHY

## 2023-09-20 ENCOUNTER — Encounter: Payer: Self-pay | Admitting: Family Medicine

## 2023-10-14 ENCOUNTER — Other Ambulatory Visit: Payer: Self-pay | Admitting: Family Medicine

## 2023-10-14 ENCOUNTER — Encounter: Payer: Self-pay | Admitting: Family Medicine

## 2023-10-14 DIAGNOSIS — R058 Other specified cough: Secondary | ICD-10-CM

## 2023-10-14 MED ORDER — PROMETHAZINE-DM 6.25-15 MG/5ML PO SYRP: 5.0000 mL | ORAL_SOLUTION | Freq: Four times a day (QID) | ORAL | 0 refills | Status: DC | PRN

## 2023-10-24 ENCOUNTER — Other Ambulatory Visit: Payer: Self-pay | Admitting: Family Medicine

## 2023-10-24 ENCOUNTER — Telehealth: Payer: Self-pay | Admitting: Family Medicine

## 2023-10-24 DIAGNOSIS — J0100 Acute maxillary sinusitis, unspecified: Secondary | ICD-10-CM

## 2023-10-24 MED ORDER — AMOXICILLIN-POT CLAVULANATE 875-125 MG PO TABS: 1.0000 | ORAL_TABLET | Freq: Two times a day (BID) | ORAL | 0 refills | Status: AC

## 2023-10-24 NOTE — Telephone Encounter (Unsigned)
 Copied from CRM [id number]. Topic: Clinical - Medication Question >> Oct 21, 2023 10:28 AM Geroge Baseman wrote: Reason for CRM: Patient calling in to follow up on her last message she sent to [facility], she states she never got a response and wants to know if someone can reach out to her. CAL was informed.

## 2023-10-24 NOTE — Telephone Encounter (Signed)
 Pt. Was contacted , message responded to and medication called in.

## 2023-11-16 ENCOUNTER — Telehealth: Payer: Self-pay | Admitting: Cardiology

## 2023-11-16 MED ORDER — FUROSEMIDE 20 MG PO TABS: 40.0000 mg | ORAL_TABLET | Freq: Every day | ORAL | 3 refills | Status: DC

## 2023-11-16 MED ORDER — POTASSIUM CHLORIDE CRYS ER 20 MEQ PO TBCR: 40.0000 meq | EXTENDED_RELEASE_TABLET | Freq: Every day | ORAL | 3 refills | Status: DC

## 2023-11-16 NOTE — Telephone Encounter (Signed)
*  STAT* If patient is at the pharmacy, call can be transferred to refill team.   1. Which medications need to be refilled? (please list name of each medication and dose if known) furosemide (LASIX) 20 MG tablet   potassium chloride SA (KLOR-CON M20) 20 MEQ tablet   2. Which pharmacy/location (including street and city if local pharmacy) is medication to be sent to? [facility] [address], [place] - [address]   3. Do they need a 30 day or 90 day supply? 90

## 2023-11-16 NOTE — Telephone Encounter (Signed)
 Refill complete

## 2023-12-19 ENCOUNTER — Encounter: Payer: Self-pay | Admitting: Adult Health

## 2023-12-19 ENCOUNTER — Ambulatory Visit: Payer: BC Managed Care – PPO | Admitting: Adult Health

## 2023-12-19 VITALS — BP 122/80 | HR 70 | Ht <= 58 in | Wt 223.0 lb

## 2023-12-19 DIAGNOSIS — R232 Flushing: Secondary | ICD-10-CM

## 2023-12-19 DIAGNOSIS — Z90710 Acquired absence of both cervix and uterus: Secondary | ICD-10-CM | POA: Diagnosis not present

## 2023-12-19 MED ORDER — ESTRADIOL 2 MG PO TABS: 2.0000 mg | ORAL_TABLET | Freq: Every day | ORAL | 2 refills | Status: AC

## 2023-12-19 NOTE — Progress Notes (Signed)
  Subjective:     Patient ID: [name], female   DOB: August 15, 1970, 53 y.o.   MRN: [id number]  HPI [name] is a 53 year old black female, single, sp hsyterctomy back in follow up on taking estrace  1 mg for hot flashes and BP check. She is travel Engineer, civil (consulting).  PCP is Blaine Bump NP  Review of Systems Hot flashes some better, sleeping better, but has been taking meno gummies too, but needs to stop has black cohosh in them    Reviewed past medical,surgical, social and family history. Reviewed medications and allergies.  Objective:   Physical Exam BP 122/80 (BP Location: Left Arm, Patient Position: Sitting, Cuff Size: Large)   Pulse 70   Ht 4\' 7"  (1.397 m)   Wt 223 lb (101.2 kg)   BMI 51.83 kg/m     Skin warm and dry.  Lungs: clear to ausculation bilaterally. Cardiovascular: regular rate and rhythm.  Fall risk is low  Upstream - 12/19/23 0930       Pregnancy Intention Screening   Does the patient want to become pregnant in the next year? N/A    Does the patient's partner want to become pregnant in the next year? N/A    Would the patient like to discuss contraceptive options today? N/A      Contraception Wrap Up   Current Method Female Sterilization   hyst   End Method Female Sterilization   hyst   Contraception Counseling Provided No             Assessment:     1. Hot flashes (Primary) Hot flashes are some better and sleeping better Will increase estrace  to 2 mg 1 daily and she is going to stop meno gummies, has black cohosh in in them Meds ordered this encounter  Medications   estradiol  (ESTRACE ) 2 MG tablet    Sig: Take 1 tablet (2 mg total) by mouth daily.    Dispense:  90 tablet    Refill:  2    Supervising Provider:   Evalyn Hillier H [[id number]]    2. S/P hysterectomy     Plan:     Follow up 03/28/24 when back home is from [place]

## 2024-01-03 ENCOUNTER — Encounter: Payer: Self-pay | Admitting: Family Medicine

## 2024-01-04 ENCOUNTER — Telehealth: Payer: Self-pay | Admitting: Pharmacy Technician

## 2024-01-04 ENCOUNTER — Other Ambulatory Visit (HOSPITAL_COMMUNITY): Payer: Self-pay

## 2024-01-04 NOTE — Telephone Encounter (Signed)
 PA request has been Received. New Encounter has been or will be created for follow up. For additional info see Pharmacy Prior Auth telephone encounter from 01/04/2024.

## 2024-01-04 NOTE — Telephone Encounter (Signed)
 Pharmacy Patient Advocate Encounter   Received notification from Patient Advice Request messages that prior authorization for ZEPBOUND 2.5MG /0.5ML AUTO-INJECTORS is required/requested.   Insurance verification completed.   The patient is insured through [facility] .   Per test claim:

## 2024-01-05 NOTE — Telephone Encounter (Signed)
 Mychart message sent to patient.

## 2024-01-06 ENCOUNTER — Other Ambulatory Visit (HOSPITAL_COMMUNITY): Payer: Self-pay

## 2024-03-12 ENCOUNTER — Ambulatory Visit: Payer: Self-pay

## 2024-03-12 NOTE — Telephone Encounter (Signed)
 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 States she will be home during that time period. Please mychart pt and advise.   FYI Only or Action Required?: appointment requested-physical  Patient was last seen in primary care on 06/03/2023 by Zarwolo, Gloria, FNP.  Called Nurse Triage reporting Dizziness.  Interventions attempted: Nothing.  Symptoms are: completely resolved.  Copied from CRM #[id number]. Topic: Clinical - Red Word Triage >> Mar 12, 2024  1:55 PM [place] J wrote: [facility] that prompted transfer to Nurse Triage: Patient fell and had vertigo Saturday morning. She is asymptomatic now and just wants to schedule a physical fro September.  ----------------------------------------------------------------------- From previous Reason for Contact - Scheduling: Patient/patient representative is calling to schedule an appointment. Refer to attachments for appointment information. Reason for Disposition  [1] MILD dizziness (e.g., vertigo; walking normally) AND [2] has been evaluated by doctor (or NP/PA) for this  Answer Assessment - Initial Assessment Questions 1. DESCRIPTION: Describe your dizziness.     States that the room was spinning, states only when supine 2. VERTIGO: Do you feel like either you or the room is spinning or tilting?      Room spinning 4. SEVERITY: How bad is it?  Can you walk?     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 ONSET:  When did the dizziness begin?     About a week ago intermittent 6. AGGRAVATING FACTORS: Does anything make it worse? (e.g., standing, change in head position)     denies 7. CAUSE: What do you think is causing the dizziness?     Unsure, vertigo 9. OTHER SYMPTOMS: Do you have any other symptoms? (e.g., earache, headache, numbness, tinnitus, vomiting, weakness)     denies  Protocols used: Dizziness - Vertigo-A-AH

## 2024-03-28 ENCOUNTER — Ambulatory Visit: Admitting: Adult Health

## 2024-04-11 ENCOUNTER — Ambulatory Visit (INDEPENDENT_AMBULATORY_CARE_PROVIDER_SITE_OTHER)

## 2024-04-11 VITALS — BP 150/90 | HR 100 | Wt 211.1 lb

## 2024-04-11 DIAGNOSIS — Z0001 Encounter for general adult medical examination with abnormal findings: Secondary | ICD-10-CM

## 2024-04-11 DIAGNOSIS — R42 Dizziness and giddiness: Secondary | ICD-10-CM

## 2024-04-11 DIAGNOSIS — Z1159 Encounter for screening for other viral diseases: Secondary | ICD-10-CM

## 2024-04-11 DIAGNOSIS — Z1211 Encounter for screening for malignant neoplasm of colon: Secondary | ICD-10-CM

## 2024-04-11 DIAGNOSIS — Z0000 Encounter for general adult medical examination without abnormal findings: Secondary | ICD-10-CM

## 2024-04-11 NOTE — Progress Notes (Addendum)
 Complete physical exam  Patient: [name]   DOB: 1971-02-22   52 y.o. Female  MRN: [id number]  Subjective:    Chief Complaint  Patient presents with   Annual Exam    [name] is a 53 y.o. female who presents today for a complete physical exam. She reports consuming a general diet. The patient does not participate in regular exercise at present. She generally feels well. She reports sleeping well. She does not have additional problems to discuss today.    Most recent fall risk assessment:    04/12/2024   11:16 AM  Fall Risk   Falls in the past year? 1  Number falls in past yr: 0  Injury with Fall? 0     Most recent depression screenings:    06/03/2023    1:21 PM 04/22/2022    8:32 AM  PHQ 2/9 Scores  PHQ - 2 Score 5 0  PHQ- 9 Score 13       Patient Active Problem List   Diagnosis Date Noted   Encounter for preventative adult health care examination 04/15/2024   Vertigo 04/15/2024   Current use of estrogen therapy 04/12/2024   Hypertension 04/12/2024   Excessive hair growth 07/18/2023   Elevated BP without diagnosis of hypertension 06/04/2023   Encounter for annual general medical examination with abnormal findings in adult 06/04/2023   Encounter for immunization 06/04/2023   Weight loss counseling, encounter for 02/18/2022   Encounter to establish care 11/19/2021   S/P hysterectomy 03/11/2021   Encounter for screening fecal occult blood testing 03/11/2021   Encounter for well woman exam with routine gynecological exam 03/11/2021   Hot flashes 03/11/2021   Chronic systolic heart failure (HCC)    Cellulitis 05/23/2017   Obesity, Class III, BMI 40-49.9 (morbid obesity) 08/19/2016   Ventral hernia 10/02/2015   Dyspepsia 11/20/2013      Patient Care Team: Bevely Doffing, FNP as PCP - General (Family Medicine) Alvan, Dorn FALCON, MD as PCP - Cardiology (Cardiology) Harvey Margo CROME, MD (Inactive) as Consulting Physician (Gastroenterology)    Outpatient Medications Prior to Visit  Medication Sig   Elderberry-Vitamin C-Zinc 50-45-3.8 MG CHEW Chew 2 1e11 Vector Genomes by mouth 1 day or 1 dose.   estradiol  (ESTRACE ) 2 MG tablet Take 1 tablet (2 mg total) by mouth daily.   furosemide  (LASIX ) 20 MG tablet Take 2 tablets (40 mg total) by mouth daily.   levocetirizine (XYZAL) 5 MG tablet Take 5 mg by mouth every evening.   metoprolol  succinate (TOPROL  XL) 25 MG 24 hr tablet Take 1 tablet (25 mg total) by mouth daily.   potassium chloride  SA (KLOR-CON  M20) 20 MEQ tablet Take 2 tablets (40 mEq total) by mouth daily.   Probiotic Product (PROBIOTIC PO) Take by mouth.   rosuvastatin  (CRESTOR ) 10 MG tablet Take 1 tablet (10 mg total) by mouth daily.   [DISCONTINUED] UNABLE TO FIND Meno-takes 2 gummies once daily (Patient not taking: Reported on 04/11/2024)   No facility-administered medications prior to visit.    ROS     Objective:     BP (!) 150/90 (BP Location: Right Arm, Patient Position: Sitting, Cuff Size: Large)   Pulse 100   Wt 211 lb 1.3 oz (95.7 kg)   SpO2 98%   BMI 49.06 kg/m  BP Readings from Last 3 Encounters:  04/12/24 (!) 160/100  04/11/24 (!) 150/90  12/19/23 122/80   Wt Readings from Last 3 Encounters:  04/12/24 213 lb (96.6 kg)  04/11/24 211 lb 1.3 oz (95.7 kg)  12/19/23 223 lb (101.2 kg)      Physical Exam Vitals and nursing note reviewed.  Constitutional:      Appearance: Normal appearance. She is obese.  HENT:     Head: Normocephalic.     Right Ear: Tympanic membrane, ear canal and external ear normal.     Left Ear: Tympanic membrane, ear canal and external ear normal.     Nose: Nose normal.     Mouth/Throat:     Mouth: Mucous membranes are moist.     Pharynx: Oropharynx is clear.  Eyes:     Extraocular Movements: Extraocular movements intact.     Pupils: Pupils are equal, round, and reactive to light.  Cardiovascular:     Rate and Rhythm: Normal rate and regular rhythm.  Pulmonary:      Effort: Pulmonary effort is normal.     Breath sounds: Normal breath sounds.  Musculoskeletal:     Cervical back: Normal range of motion and neck supple.  Skin:    General: Skin is warm and dry.  Neurological:     Mental Status: She is alert and oriented to person, place, and time.  Psychiatric:        Mood and Affect: Mood normal.        Thought Content: Thought content normal.       Assessment & Plan:    Routine Health Maintenance and Physical Exam  Immunization History  Administered Date(s) Administered   Influenza, Seasonal, Injecte, Preservative Fre 06/03/2023   Moderna Sars-Covid-2 Vaccination 09/19/2019, 10/17/2019, 06/26/2020    Health Maintenance  Topic Date Due   DTaP/Tdap/Td (1 - Tdap) Never done   Pneumococcal Vaccine: 50+ Years (1 of 2 - PCV) Never done   Hepatitis B Vaccines 19-59 Average Risk (1 of 3 - 19+ 3-dose series) Never done   Colonoscopy  Never done   Zoster Vaccines- Shingrix (1 of 2) Never done   COVID-19 Vaccine (4 - 2025-26 season) 04/09/2024   Influenza Vaccine  11/06/2024 (Originally 03/09/2024)   MAMMOGRAM  09/15/2024   Hepatitis C Screening  Completed   HIV Screening  Completed   HPV VACCINES  Aged Out   Meningococcal B Vaccine  Aged Out    Discussed health benefits of physical activity, and encouraged her to engage in regular exercise appropriate for her age and condition.  Problem List Items Addressed This Visit       Other   Encounter for preventative adult health care examination - Primary   Adult Wellness Visit Discussed regular screenings and preventive care. She opted for Cologuard for colon cancer screening. - Order lipid panel. - Order CBC. - Order TSH. - Order Hepatitis C screening. - Order Cologuard test for colon cancer screening.       Relevant Orders   CMP14+EGFR (Completed)   Lipid panel (Completed)   CBC (Completed)   TSH + free T4 (Completed)   Vertigo   Vertigo Intermittent vertigo since a fall, likely  inner ear issue or vestibular dysfunction. Possible mild concussion discussed. - Recommend vestibular exercises to reset ear crystals.       Other Visit Diagnoses       Need for hepatitis C screening test       Relevant Orders   Hepatitis C Antibody (Completed)     Screening for colon cancer       Relevant Orders   Cologuard      No follow-ups on file.  Leita Longs, FNP

## 2024-04-12 ENCOUNTER — Encounter: Payer: Self-pay | Admitting: Adult Health

## 2024-04-12 ENCOUNTER — Ambulatory Visit: Admitting: Adult Health

## 2024-04-12 VITALS — BP 160/100 | HR 64 | Ht <= 58 in | Wt 213.0 lb

## 2024-04-12 DIAGNOSIS — Z79818 Long term (current) use of other agents affecting estrogen receptors and estrogen levels: Secondary | ICD-10-CM

## 2024-04-12 DIAGNOSIS — Z90710 Acquired absence of both cervix and uterus: Secondary | ICD-10-CM

## 2024-04-12 DIAGNOSIS — R232 Flushing: Secondary | ICD-10-CM | POA: Diagnosis not present

## 2024-04-12 DIAGNOSIS — I10 Essential (primary) hypertension: Secondary | ICD-10-CM | POA: Diagnosis not present

## 2024-04-12 DIAGNOSIS — Z1159 Encounter for screening for other viral diseases: Secondary | ICD-10-CM | POA: Diagnosis not present

## 2024-04-12 DIAGNOSIS — Z0000 Encounter for general adult medical examination without abnormal findings: Secondary | ICD-10-CM | POA: Diagnosis not present

## 2024-04-12 NOTE — Progress Notes (Signed)
  Subjective:     Patient ID: [name], female   DOB: 12-12-1970, 53 y.o.   MRN: [id number]  HPI [name] is a 53 year old black female,single, sp hysterectomy, back in follow up on increasing estrace  to 2 mg and hot flashes are much better and sleeping better too.  She had physical with Leita Longs NP yesterday. She fell in July, did not hit her head and has had dizziness on and off since, some associated with room spinning and nausea and vomiting, has tried meclizine and she discussed yesterday with Leita.   PCP is Leita Longs FNP  Review of Systems Hot flashes are better Sleeping better Dizziness on and off since fall in July, discussed with PCP Reviewed past medical,surgical, social and family history. Reviewed medications and allergies.     Objective:   Physical Exam BP (!) 160/100 (BP Location: Right Arm, Patient Position: Sitting, Cuff Size: Normal)   Pulse 64   Ht 4' 7 (1.397 m)   Wt 213 lb (96.6 kg)   BMI 49.51 kg/m     Skin warm and dry.Lungs: clear to ausculation bilaterally. Cardiovascular: regular rate and rhythm.  Fall risk is low  Upstream - 04/12/24 1116       Pregnancy Intention Screening   Does the patient want to become pregnant in the next year? N/A    Does the patient's partner want to become pregnant in the next year? N/A    Would the patient like to discuss contraceptive options today? N/A      Contraception Wrap Up   Current Method Female Sterilization   hyst   End Method Female Sterilization   hyst   Contraception Counseling Provided No          Assessment:     1. Hot flashes Better on estrace  2 mg   2. S/P hysterectomy  3. Current use of estrogen therapy (Primary) Continue estrace  2 mg 1 daily, has refills Follow up in 6 months or sooner if needed   4. Hypertension, unspecified type Take BP meds and follow up with PCP She says BP good at home     Plan:     Follow up in 6 months or sooner if needed

## 2024-04-13 LAB — CMP14+EGFR
ALT: 23 IU/L (ref 0–32)
AST: 22 IU/L (ref 0–40)
Albumin: 4.5 g/dL (ref 3.9–4.9)
Alkaline Phosphatase: 52 IU/L (ref 44–121)
BUN/Creatinine Ratio: 8 — ABNORMAL LOW (ref 9–23)
BUN: 6 mg/dL (ref 6–24)
Bilirubin Total: 0.4 mg/dL (ref 0.0–1.2)
CO2: 21 mmol/L (ref 20–29)
Calcium: 9.3 mg/dL (ref 8.7–10.2)
Chloride: 104 mmol/L (ref 96–106)
Creatinine, Ser: 0.75 mg/dL (ref 0.57–1.00)
Globulin, Total: 3.4 g/dL (ref 1.5–4.5)
Glucose: 81 mg/dL (ref 70–99)
Potassium: 4.3 mmol/L (ref 3.5–5.2)
Sodium: 141 mmol/L (ref 134–144)
Total Protein: 7.9 g/dL (ref 6.0–8.5)
eGFR: 97 mL/min/1.73 (ref >59)

## 2024-04-13 LAB — TSH+FREE T4
Free T4: 1.33 ng/dL (ref 0.82–1.77)
TSH: 0.628 u[IU]/mL (ref 0.450–4.500)

## 2024-04-13 LAB — CBC
Hematocrit: 40.2 % (ref 34.0–46.6)
Hemoglobin: 13.1 g/dL (ref 11.1–15.9)
MCH: 30.5 pg (ref 26.6–33.0)
MCHC: 32.6 g/dL (ref 31.5–35.7)
MCV: 94 fL (ref 79–97)
Platelets: 267 x10E3/uL (ref 150–450)
RBC: 4.29 x10E6/uL (ref 3.77–5.28)
RDW: 13.7 % (ref 11.7–15.4)
WBC: 7.6 x10E3/uL (ref 3.4–10.8)

## 2024-04-13 LAB — LIPID PANEL
Chol/HDL Ratio: 2.3 ratio (ref 0.0–4.4)
Cholesterol, Total: 161 mg/dL (ref 100–199)
HDL: 71 mg/dL (ref >39)
LDL Chol Calc (NIH): 80 mg/dL (ref 0–99)
Triglycerides: 47 mg/dL (ref 0–149)
VLDL Cholesterol Cal: 10 mg/dL (ref 5–40)

## 2024-04-13 LAB — HEPATITIS C ANTIBODY: Hep C Virus Ab: NONREACTIVE

## 2024-04-15 ENCOUNTER — Ambulatory Visit: Payer: Self-pay

## 2024-04-15 DIAGNOSIS — R42 Dizziness and giddiness: Secondary | ICD-10-CM | POA: Insufficient documentation

## 2024-04-15 DIAGNOSIS — Z0000 Encounter for general adult medical examination without abnormal findings: Secondary | ICD-10-CM | POA: Insufficient documentation

## 2024-04-15 NOTE — Assessment & Plan Note (Signed)
 Adult Wellness Visit Discussed regular screenings and preventive care. She opted for Cologuard for colon cancer screening. - Order lipid panel. - Order CBC. - Order TSH. - Order Hepatitis C screening. - Order Cologuard test for colon cancer screening.

## 2024-04-15 NOTE — Assessment & Plan Note (Signed)
 Vertigo Intermittent vertigo since a fall, likely inner ear issue or vestibular dysfunction. Possible mild concussion discussed. - Recommend vestibular exercises to reset ear crystals.

## 2024-05-29 ENCOUNTER — Telehealth: Payer: Self-pay

## 2024-05-29 NOTE — Telephone Encounter (Signed)
 Email cpe form back to [email or web address] and copy put at front desk for patient to pick up

## 2024-06-02 ENCOUNTER — Other Ambulatory Visit: Payer: Self-pay | Admitting: Cardiology

## 2024-06-04 ENCOUNTER — Telehealth: Payer: Self-pay | Admitting: Cardiology

## 2024-06-04 MED ORDER — METOPROLOL SUCCINATE ER 25 MG PO TB24: 25.0000 mg | ORAL_TABLET | Freq: Every day | ORAL | 0 refills | Status: DC

## 2024-06-04 NOTE — Telephone Encounter (Signed)
*  STAT* If patient is at the pharmacy, call can be transferred to refill team.   1. Which medications need to be refilled? (please list name of each medication and dose if known)   metoprolol  succinate (TOPROL -XL) 25 MG 24 hr tablet     4. Which pharmacy/location (including street and city if local pharmacy) is medication to be sent to?  [facility] - [place], [place] - [address] Phone: [phone]  Fax: [phone]       5. Do they need a 30 day or 90 day supply? 90   Sch 1/29

## 2024-06-04 NOTE — Telephone Encounter (Signed)
 Pt's medication was sent to pt's pharmacy as requested. Confirmation received.

## 2024-06-08 ENCOUNTER — Encounter: Payer: BC Managed Care – PPO | Admitting: Family Medicine

## 2024-06-14 ENCOUNTER — Ambulatory Visit: Admitting: Physician Assistant

## 2024-06-16 DIAGNOSIS — Z1211 Encounter for screening for malignant neoplasm of colon: Secondary | ICD-10-CM | POA: Diagnosis not present

## 2024-06-22 LAB — COLOGUARD: COLOGUARD: NEGATIVE

## 2024-08-31 ENCOUNTER — Other Ambulatory Visit: Payer: Self-pay | Admitting: Cardiology

## 2024-09-06 ENCOUNTER — Ambulatory Visit: Admitting: Cardiology

## 2024-09-14 ENCOUNTER — Encounter: Payer: Self-pay | Admitting: Student

## 2024-09-14 ENCOUNTER — Ambulatory Visit: Admitting: Student

## 2024-09-14 ENCOUNTER — Other Ambulatory Visit (HOSPITAL_COMMUNITY): Admission: RE | Admit: 2024-09-14 | Source: Ambulatory Visit

## 2024-09-14 ENCOUNTER — Ambulatory Visit: Payer: Self-pay | Admitting: Student

## 2024-09-14 VITALS — BP 132/82 | Ht <= 58 in | Wt 211.2 lb

## 2024-09-14 DIAGNOSIS — I447 Left bundle-branch block, unspecified: Secondary | ICD-10-CM

## 2024-09-14 DIAGNOSIS — E7849 Other hyperlipidemia: Secondary | ICD-10-CM

## 2024-09-14 DIAGNOSIS — I5020 Unspecified systolic (congestive) heart failure: Secondary | ICD-10-CM

## 2024-09-14 DIAGNOSIS — I10 Essential (primary) hypertension: Secondary | ICD-10-CM

## 2024-09-14 LAB — LIPID PANEL
Cholesterol: 200 mg/dL (ref 0–200)
HDL: 71 mg/dL
LDL Cholesterol: 122 mg/dL — ABNORMAL HIGH (ref 0–99)
Total CHOL/HDL Ratio: 2.8 ratio
Triglycerides: 39 mg/dL
VLDL: 8 mg/dL (ref 0–40)

## 2024-09-14 MED ORDER — POTASSIUM CHLORIDE CRYS ER 20 MEQ PO TBCR: 40.0000 meq | EXTENDED_RELEASE_TABLET | Freq: Every day | ORAL | 3 refills | Status: AC

## 2024-09-14 MED ORDER — METOPROLOL SUCCINATE ER 25 MG PO TB24: 25.0000 mg | ORAL_TABLET | Freq: Every day | ORAL | 3 refills | Status: AC

## 2024-09-14 MED ORDER — FUROSEMIDE 20 MG PO TABS: 40.0000 mg | ORAL_TABLET | Freq: Every day | ORAL | 3 refills | Status: AC

## 2024-09-14 NOTE — Patient Instructions (Signed)
 Medication Instructions:  Your physician recommends that you continue on your current medications as directed. Please refer to the Current Medication list given to you today.  *If you need a refill on your cardiac medications before your next appointment, please call your pharmacy*  Lab Work: Fasting Lipid Panel  If you have labs (blood work) drawn today and your tests are completely normal, you will receive your results only by: MyChart Message (if you have MyChart) OR A paper copy in the mail If you have any lab test that is abnormal or we need to change your treatment, we will call you to review the results.  Testing/Procedures: None  Follow-Up: At [facility], you and your health needs are our priority.  As part of our continuing mission to provide you with exceptional heart care, our providers are all part of one team.  This team includes your primary Cardiologist (physician) and Advanced Practice Providers or APPs (Physician Assistants and Nurse Practitioners) who all work together to provide you with the care you need, when you need it.  Your next appointment:   1 year(s)  Provider:   You may see Alvan Carrier, MD or one of the following Advanced Practice Providers on your designated Care Team:   Laymon Qua, PA-C  Scotesia Hartleton, [place] Olivia Pavy, [place]     We recommend signing up for the patient portal called MyChart.  Sign up information is provided on this After Visit Summary.  MyChart is used to connect with patients for Virtual Visits (Telemedicine).  Patients are able to view lab/test results, encounter notes, upcoming appointments, etc.  Non-urgent messages can be sent to your provider as well.   To learn more about what you can do with MyChart, go to [email or web address].   Other Instructions Thank you for choosing [facility]!

## 2024-09-14 NOTE — Progress Notes (Signed)
 "  Cardiology Office Note    Date:  09/14/2024  ID:  PERRI ARAGONES, DOB [birth year], MRN [id number] Cardiologist: Alvan Carrier, MD { : History of Present Illness:    [name] is a 54 y.o. 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 She did report having lost over 40 pounds in the setting of dietary changes and exercising more frequently. She was no longer on Entresto  due to hypotension and had not utilized Lasix  recently. She had been experiencing tachycardia and Toprol -XL was increased to 25 mg daily and she was encouraged to follow this for 2 weeks and report back with readings. She has not been evaluated by Cardiology since.   In talking with the patient today, she reports overall doing well from a cardiac perspective since her last office visit. 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 Reports her breathing has been stable and denies any dyspnea on exertion, orthopnea, PND or pitting edema. No recent chest pain or palpitations. She has been making dietary changes and is now participating in Weight Watches. Also does Pilates at times. She only takes her Lasix  and potassium supplementation when she has takeout food which is approximately twice per week.  Studies Reviewed:   EKG: EKG is ordered today and demonstrates:   EKG Interpretation Date/Time:  Friday September 14 2024 14:37:20 EST Ventricular Rate:  65 PR Interval:  194 QRS Duration:  154 QT Interval:  458 QTC Calculation: 476 R Axis:   180  Text Interpretation: Normal sinus rhythm Right axis deviation Left bundle  branch block Confirmed by Johnson Grate ([id number]) on 09/14/2024 2:40:02 PM       Cardiac Catheterization: 01/2021 There is moderate to severe left ventricular systolic dysfunction. The left ventricular ejection fraction is 25-35% by visual estimate. LV end diastolic pressure is normal. There is no aortic valve stenosis. Ao sat 100%, PA sat 81%, mean PA pressure 24 mm Hg; mean PCWP 14 mm Hg; CO 8.3 L/min; CI 4.5. Upper normal PA pressure. No angiographically apparent coronary artery disease.   Medical therapy for nonischemic cardiomyopathy.   Limited Echo: 01/2021 IMPRESSIONS     1. Limited study.   2. Left ventricular ejection fraction, by estimation, is 50 to 55%. The  left ventricle has low normal function.   3. Right ventricular systolic function is normal. The right ventricular  size is normal.   4. The inferior vena cava is normal in size with greater than 50%  respiratory variability, suggesting right atrial pressure of 3 mmHg.    Physical Exam:   VS:  BP 132/82   Ht 4' 7 (1.397 m)   Wt 211 lb 3.2 oz (95.8 kg)   SpO2 97%   BMI 49.09 kg/m    Wt Readings from Last 3 Encounters:  09/14/24 211 lb 3.2 oz (95.8 kg)  04/12/24 213 lb (96.6 kg)  04/11/24 211 lb 1.3 oz (95.7 kg)     HZW:Eozjdjwu female appearing in no acute distress NECK: No JVD; No carotid bruits CARDIAC: RRR, no murmurs, rubs, gallops RESPIRATORY:  Clear to auscultation without rales, wheezing  or rhonchi  ABDOMEN: Appears non-distended. No obvious abdominal masses. EXTREMITIES: No clubbing or cyanosis. No pitting edema.  Distal pedal pulses are 2+ bilaterally.   Assessment and Plan:   1. 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  - She appears euvolemic by examination today with no recent respiratory issues. Continue with  Lasix  and potassium supplementation as needed (currently using approximately twice per week). Continue Toprol -XL 25 mg daily. No indication to start an SGLT2i at this time given no fluid retention. She was previously intolerant to Entresto  secondary to hypotension.  2. LBBB (left bundle branch block) - Noted on EKG today and prior tracings as well. Prior cardiac catheterization in 01/2021 showed no angiographic evidence of CAD.  3. Essential hypertension - BP is at 132/84 during today's visit and she reports this has overall been well-controlled when checked at home. Does have occasional episodes of hypotension with SBP in the 90's. Continue Toprol -XL 25 mg daily for now.  4. Other hyperlipidemia - FLP in 04/2024 showed her total cholesterol had improved from 200 to 161 and LDL was previously 132 and was now down to 80. Continue current medical therapy with Crestor  10 mg daily.  Signed, Laymon CHRISTELLA Qua, PA-C   "

## 2025-04-12 ENCOUNTER — Encounter
